# Patient Record
Sex: Male | Born: 1990 | Race: White | Hispanic: No | Marital: Single | Smoking: Current every day smoker
Health system: Southern US, Community
[De-identification: ages and names within clinical notes are randomized; demographics above are authoritative.]

## PROBLEM LIST (undated history)

## (undated) DIAGNOSIS — F329 Major depressive disorder, single episode, unspecified: Secondary | ICD-10-CM

## (undated) DIAGNOSIS — M1990 Unspecified osteoarthritis, unspecified site: Secondary | ICD-10-CM

## (undated) DIAGNOSIS — F32A Depression, unspecified: Secondary | ICD-10-CM

## (undated) DIAGNOSIS — F319 Bipolar disorder, unspecified: Secondary | ICD-10-CM

## (undated) DIAGNOSIS — F419 Anxiety disorder, unspecified: Secondary | ICD-10-CM

## (undated) DIAGNOSIS — F909 Attention-deficit hyperactivity disorder, unspecified type: Secondary | ICD-10-CM

## (undated) HISTORY — DX: Major depressive disorder, single episode, unspecified: F32.9

## (undated) HISTORY — DX: Bipolar disorder, unspecified: F31.9

## (undated) HISTORY — DX: Anxiety disorder, unspecified: F41.9

## (undated) HISTORY — DX: Attention-deficit hyperactivity disorder, unspecified type: F90.9

## (undated) HISTORY — DX: Depression, unspecified: F32.A

## (undated) HISTORY — DX: Unspecified osteoarthritis, unspecified site: M19.90

---
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
## 2011-11-29 ENCOUNTER — Emergency Department (HOSPITAL_COMMUNITY): Payer: BC Managed Care – PPO

## 2011-11-29 ENCOUNTER — Emergency Department (HOSPITAL_COMMUNITY)
Admission: EM | Admit: 2011-11-29 | Discharge: 2011-11-30 | Disposition: A | Discharge status: Home / Self Care | Payer: BC Managed Care – PPO | Attending: Emergency Medicine | Admitting: Emergency Medicine

## 2011-11-29 ENCOUNTER — Other Ambulatory Visit: Payer: Self-pay

## 2011-11-29 ENCOUNTER — Encounter (HOSPITAL_COMMUNITY): Payer: Self-pay | Admitting: *Deleted

## 2011-11-29 DIAGNOSIS — F29 Unspecified psychosis not due to a substance or known physiological condition: Secondary | ICD-10-CM | POA: Insufficient documentation

## 2011-11-29 DIAGNOSIS — F23 Brief psychotic disorder: Secondary | ICD-10-CM

## 2011-11-29 DIAGNOSIS — R4182 Altered mental status, unspecified: Secondary | ICD-10-CM | POA: Insufficient documentation

## 2011-11-29 DIAGNOSIS — F603 Borderline personality disorder: Secondary | ICD-10-CM | POA: Insufficient documentation

## 2011-11-29 DIAGNOSIS — R000 Tachycardia, unspecified: Secondary | ICD-10-CM | POA: Insufficient documentation

## 2011-11-29 LAB — RAPID URINE DRUG SCREEN, HOSP PERFORMED
Amphetamines: NOT DETECTED
Barbiturates: NOT DETECTED
Benzodiazepines: POSITIVE — AB
Cocaine: NOT DETECTED
Opiates: NOT DETECTED
Tetrahydrocannabinol: POSITIVE — AB

## 2011-11-29 LAB — URINALYSIS, ROUTINE W REFLEX MICROSCOPIC
Bilirubin Urine: NEGATIVE
Glucose, UA: NEGATIVE mg/dL
Hgb urine dipstick: NEGATIVE
Ketones, ur: NEGATIVE mg/dL
Leukocytes, UA: NEGATIVE
Nitrite: NEGATIVE
Protein, ur: NEGATIVE mg/dL
Specific Gravity, Urine: 1.011 (ref 1.005–1.030)
Urobilinogen, UA: 0.2 mg/dL (ref 0.0–1.0)
pH: 8 (ref 5.0–8.0)

## 2011-11-29 LAB — DIFFERENTIAL
Basophils Absolute: 0 K/uL (ref 0.0–0.1)
Basophils Relative: 0 % (ref 0–1)
Eosinophils Absolute: 0 K/uL (ref 0.0–0.7)
Eosinophils Relative: 0 % (ref 0–5)
Lymphocytes Relative: 11 % — ABNORMAL LOW (ref 12–46)
Lymphs Abs: 1.6 K/uL (ref 0.7–4.0)
Monocytes Absolute: 0.8 K/uL (ref 0.1–1.0)
Monocytes Relative: 6 % (ref 3–12)
Neutro Abs: 11.5 K/uL — ABNORMAL HIGH (ref 1.7–7.7)
Neutrophils Relative %: 83 % — ABNORMAL HIGH (ref 43–77)

## 2011-11-29 LAB — COMPREHENSIVE METABOLIC PANEL WITH GFR
ALT: 18 U/L (ref 0–53)
AST: 21 U/L (ref 0–37)
Albumin: 4.1 g/dL (ref 3.5–5.2)
Alkaline Phosphatase: 74 U/L (ref 39–117)
BUN: 9 mg/dL (ref 6–23)
CO2: 23 meq/L (ref 19–32)
Calcium: 9.9 mg/dL (ref 8.4–10.5)
Chloride: 104 meq/L (ref 96–112)
Creatinine, Ser: 0.86 mg/dL (ref 0.50–1.35)
GFR calc Af Amer: >90 mL/min
GFR calc non Af Amer: >90 mL/min
Glucose, Bld: 135 mg/dL — ABNORMAL HIGH (ref 70–99)
Potassium: 3.6 meq/L (ref 3.5–5.1)
Sodium: 140 meq/L (ref 135–145)
Total Bilirubin: 0.5 mg/dL (ref 0.3–1.2)
Total Protein: 7.1 g/dL (ref 6.0–8.3)

## 2011-11-29 LAB — CBC
HCT: 43.7 % (ref 39.0–52.0)
Hemoglobin: 15.9 g/dL (ref 13.0–17.0)
MCH: 30.7 pg (ref 26.0–34.0)
MCHC: 36.4 g/dL — ABNORMAL HIGH (ref 30.0–36.0)
MCV: 84.4 fL (ref 78.0–100.0)
Platelets: 177 K/uL (ref 150–400)
RBC: 5.18 MIL/uL (ref 4.22–5.81)
RDW: 12.2 % (ref 11.5–15.5)
WBC: 14 K/uL — ABNORMAL HIGH (ref 4.0–10.5)

## 2011-11-29 LAB — ETHANOL: Alcohol, Ethyl (B): <11 mg/dL (ref 0–11)

## 2011-11-29 LAB — SALICYLATE LEVEL: Salicylate Lvl: <2 mg/dL — ABNORMAL LOW (ref 2.8–20.0)

## 2011-11-29 LAB — ACETAMINOPHEN LEVEL: Acetaminophen (Tylenol), Serum: <15 ug/mL (ref 10–30)

## 2011-11-29 MED ORDER — ZIPRASIDONE MESYLATE 20 MG IM SOLR
10.0000 mg | Freq: Once | INTRAMUSCULAR | Status: AC
  Administered 2011-11-29: 10 mg via INTRAMUSCULAR

## 2011-11-29 MED ORDER — LORAZEPAM 2 MG/ML IJ SOLN
2.0000 mg | Freq: Once | INTRAMUSCULAR | Status: AC
  Administered 2011-11-29: 2 mg via INTRAVENOUS
  Filled 2011-11-29: qty 1

## 2011-11-29 MED ORDER — SODIUM CHLORIDE 0.9 % IV BOLUS (SEPSIS)
1000.0000 mL | Freq: Once | INTRAVENOUS | Status: AC
  Administered 2011-11-29: 1000 mL via INTRAVENOUS

## 2011-11-29 MED ORDER — ZIPRASIDONE MESYLATE 20 MG IM SOLR
10.0000 mg | Freq: Once | INTRAMUSCULAR | Status: AC
  Administered 2011-11-29: 10 mg via INTRAMUSCULAR
  Filled 2011-11-29: qty 20

## 2011-11-29 NOTE — ED Notes (Signed)
Resident at bedside.  

## 2011-11-29 NOTE — ED Provider Notes (Signed)
History     CSN: [id number]  Arrival date & time 11/29/11  7253   First MD Initiated Contact with Patient 11/29/11 1842      Chief Complaint  Patient presents with  . Aggressive Behavior  . Altered Mental Status    (Consider location/radiation/quality/duration/timing/severity/associated sxs/prior treatment) Patient is a 21 y.o. male presenting with altered mental status. The history is provided by the EMS personnel. The history is limited by the condition of the patient.  Altered Mental Status This is a new problem. The current episode started today. The problem occurs constantly. The problem has been unchanged. Pertinent negatives include no vomiting. The symptoms are aggravated by nothing. He has tried nothing for the symptoms.    History reviewed. No pertinent past medical history.  No past surgical history on file.  No family history on file.  History  Substance Use Topics  . Smoking status: Not on file  . Smokeless tobacco: Not on file  . Alcohol Use: Not on file      Review of Systems  Unable to perform ROS: Mental status change  Gastrointestinal: Negative for vomiting.  Psychiatric/Behavioral: Positive for altered mental status.    Allergies  Review of patient's allergies indicates no known allergies.  Home Medications  No current outpatient prescriptions on file.  BP 145/73  Pulse 106  Temp(Src) 97.5 F (36.4 C) (Oral)  Resp 18  SpO2 99%  Physical Exam  Nursing note and vitals reviewed. Constitutional: He is oriented to person, place, and time. He appears well-developed and well-nourished.  HENT:  Head: Normocephalic and atraumatic.  Right Ear: External ear normal.  Left Ear: External ear normal.  Nose: Nose normal.  Eyes: Pupils are equal, round, and reactive to light.  Neck: Neck supple.  Cardiovascular: Regular rhythm, normal heart sounds and intact distal pulses.  Tachycardia present.   Pulmonary/Chest: Effort normal and breath sounds  normal.  Abdominal: Soft. He exhibits no distension and no mass. There is no tenderness. There is no rebound and no guarding.  Musculoskeletal: He exhibits no edema.  Lymphadenopathy:    He has no cervical adenopathy.  Neurological: He is alert and oriented to person, place, and time. GCS eye subscore is 4. GCS verbal subscore is 3. GCS motor subscore is 5.  Skin: Skin is warm and dry.    ED Course  Procedures (including critical care time)  Labs Reviewed  CBC - Abnormal; Notable for the following:    WBC 14.0 (*)    MCHC 36.4 (*)    All other components within normal limits  DIFFERENTIAL - Abnormal; Notable for the following:    Neutrophils Relative 83 (*)    Neutro Abs 11.5 (*)    Lymphocytes Relative 11 (*)    All other components within normal limits  COMPREHENSIVE METABOLIC PANEL - Abnormal; Notable for the following:    Glucose, Bld 135 (*)    All other components within normal limits  SALICYLATE LEVEL - Abnormal; Notable for the following:    Salicylate Lvl <2.0 (*)    All other components within normal limits  URINE RAPID DRUG SCREEN (HOSP PERFORMED) - Abnormal; Notable for the following:    Benzodiazepines POSITIVE (*)    Tetrahydrocannabinol POSITIVE (*)    All other components within normal limits  ACETAMINOPHEN LEVEL  ETHANOL  URINALYSIS, ROUTINE W REFLEX MICROSCOPIC  URINE CULTURE   Ct Head Wo Contrast  11/29/2011  *RADIOLOGY REPORT*  Clinical Data: Altered mental status.  CT HEAD WITHOUT CONTRAST  Technique:  Contiguous axial images were obtained from the base of the skull through the vertex without contrast.  Comparison: None.  Findings: The ventricles are normal.  No extra-axial fluid collections are identified.  No CT findings for hemispheric infarction or intracranial hemorrhage.  There is a small, 3.5 mm hyperdense lesion near the foramen of Monro with a small calcification adjacent to it.  This could be a colloid cyst.  I would recommend a follow-up CT scan  in 6 months to reevaluate.  The bony structures are intact.  The paranasal sinuses and mastoid air cells are clear.  IMPRESSION:  1.  No acute intracranial findings. 2.  Small hyperdense lesion near the foramen and could be a small colloid cyst with adjacent calcification.  Recommend follow-up CT scan in 6 months.  Original Report Authenticated By: P. Loralie Champagne, M.D.   Dg Chest Portable 1 View  11/29/2011  *RADIOLOGY REPORT*  Clinical Data: Altered mental status.  PORTABLE CHEST - 1 VIEW  Comparison: None.  Findings: The cardiac silhouette, mediastinal and hilar contours are within normal limits.  The lungs are clear.  The bony thorax is intact.  IMPRESSION: No acute cardiopulmonary findings.  Original Report Authenticated By: P. Loralie Champagne, M.D.     Date: 11/30/2011  Rate: 96  Rhythm: normal sinus rhythm  QRS Axis: right  Intervals: normal  ST/T Wave abnormalities: normal  Conduction Disutrbances:none  Narrative Interpretation:   Old EKG Reviewed: none available   1. Acute psychosis       MDM  21 yo male presents with acute agitation/psychosis. Per GF patient has been acting strange for 2-3 days, and she denies any drug or alcohol use. Has been sleeping less and talking about his "inventions". Required multiple benzos as well as geodon in ED to get workup. CT head ok, no signs of infection. After medicines patient now acting more normal per family, and patient does not know what's been happening. Discussed with mom and girlfriend and patient, and no known psych hx for patient or family. Discussed with psychiatrist, who evaluated patient and feels he had acute psychotic break, and will recommend follow up but does not recommend medications. Will discuss precautions and d/c home. No SI/HI        Pricilla Loveless, MD 11/30/11 [phone]

## 2011-11-29 NOTE — ED Notes (Signed)
Foley catheter inserted by previous shift.

## 2011-11-29 NOTE — ED Notes (Signed)
Patient still in CT; Dr. Fredricka Bonine at bedside, per patient request.

## 2011-11-29 NOTE — ED Notes (Signed)
RN with patient in CT for past 30 minutes; patient refusing to transfer to CT stretcher.  Patient speaking about talking with God and states "I am [facility]".  Patient states that he does not trust staff and that he wants his Foley Catheter taken out.  Informed patient that catheter can be taken out after CT scan is finished and he is taken back to his room.  Family member at bedside in CT.  After 10 mg Geodon given, patient still combative; Dr. Fredricka Bonine informed.

## 2011-11-29 NOTE — ED Notes (Signed)
Received bedside report from [place], [place].  Patient currently resting quietly in bed; no respiratory or acute distress noted.  Updated family on plan of care; informed family that patient is going to be taken to CT.  Family has no questions or concerns at this time; will continue to monitor.

## 2011-11-29 NOTE — ED Notes (Signed)
#  14 foley cath placed. Procedure explained to patient, sterile technique used, clear yellow urine returned. Patient tolerated well.

## 2011-11-29 NOTE — ED Notes (Signed)
EKG done at 1945.

## 2011-11-29 NOTE — ED Notes (Signed)
Patient transported back to CT with RN; attempting to attach patient back to 5-lead (patient pulling at leads).  Family present at bedside.  Will continue to monitor.

## 2011-11-29 NOTE — ED Notes (Addendum)
ems called to scene due to noted change in behavior,  ems found patient crawling on the floor,  Talking incoherently.  Patient became resistive to care when ems arrived. Patient continues to talk incoherently upon arrival.  Patient was given versed 2.5mg  x 2 and arrives restrained due to attempts to get up.  Patient friend denies patient taking any drugs/substances today.  Patient is mumbling upon arrival.  Patient pupils are pinpoint.  cbg 123

## 2011-11-29 NOTE — ED Notes (Signed)
Patient currently resting quietly in bed; no respiratory or acute distress noted.  Family present at bedside.  Updated patient and family on plan of care; informed family that we are currently waiting on EDP to come and talk to family about test results.  Patient has no other questions or concerns at this time; will continue to monitor.

## 2011-11-30 LAB — URINE CULTURE
Colony Count: NO GROWTH
Culture  Setup Time: 201304010236
Culture: NO GROWTH

## 2011-11-30 NOTE — ED Notes (Signed)
No rx given, pt voiced understanding to f/u with [facility]

## 2011-11-30 NOTE — ED Notes (Addendum)
Telepsych machine placed at bedside; telepsych MD speaking with patient this time.  EMT at bedside to chaperone.

## 2011-11-30 NOTE — ED Notes (Signed)
Patient currently resting quietly in bed; no respiratory or acute distress noted.  Patient updated on plan of care; informed patient that EDP is putting in for a consult to telepsych.  Patient has no other questions or concerns at this time; will continue to monitor.

## 2011-11-30 NOTE — ED Notes (Signed)
No need for restraints at this time; will with-hold restraints.  Restraints at bedside from previous shift.

## 2011-11-30 NOTE — ED Notes (Addendum)
Telepsych finished; physician spoke with EDP (Dr. Juleen China) on phone.  Patient currently sitting up in bed; no respiratory or acute distress noted; patient updated on plan of care; informed patient that we are currently for EDP to talk with patient.  Patient has no other questions or concerns at this time; will continue to monitor.

## 2011-11-30 NOTE — Discharge Instructions (Signed)
RESOURCE GUIDE  Dental Problems  Patients with Medicaid: [facility]                     [facility] [address]                                           [address] Phone:  [phone]                                                  Phone:  [phone]  If unable to pay or uninsured, contact:  [facility] or [facility] to become qualified for the adult dental clinic.  Chronic Pain Problems Contact [facility]  [phone] Patients need to be referred by their primary care doctor.  Insufficient Money for Medicine Contact [employer]:  call "211" or [facility] 271-5999.  No Primary Care Doctor Call [facility]  832-8000 Other agencies that provide inexpensive medical care    [facility]  [phone]    [facility]  [phone]    [facility]  [phone]    Women[facility] Clinic  [phone]    [facility]  [phone]    [facility]  [phone]  Psychological Services [facility]  [phone] [facility]  [phone] [facility]   800 853-5163 (emergency services 641-4993)  Substance Abuse Resources [facility]  336-882-2125 [facility] 336-784-9470 [facility] 336-285-9073 [facility] 336-845-3988 Residential & Outpatient Substance Abuse Program  800-659-3381  Abuse/Neglect [place] Child Abuse Hotline (336) 641-3795 [place] Child Abuse Hotline 800-378-5315 (After Hours)  Emergency Shelter [facility] (336) 271-5985  M[facility] (336) 275-9566 [facility] (704) 372-4663  MRSA Hotline #:   832-7006    [place] [facility]  [facility] [place]     [employer]                          [facility] [address] [place]                       [address]      [address]  [place]                                                [place]                            [place] Phone:  [phone]                                   Phone:  [phone]                 Phone:  [phone]  [facility] Phone:  [phone]    [facility] Child Abuse Hotline 501-620-9502 7400318443 (After Hours)    Psychosis Psychosis refers to a severe lack of understanding with reality. During a psychotic episode, you are not able to think clearly. During a psychotic episode, your responses and emotions are inappropriate and do not coincide with what is actually happening. You often have false beliefs about what is happening or who you are (delusions), and you may see, hear, taste, smell, or feel things that are not present (hallucinations). Psychosis is usually a severe symptom of a very serious mental health (psychiatric) condition, but it can sometimes be the result of a medical condition. CAUSES   Psychiatric conditions, such as:   Schizophrenia.   Bipolar disorder.   Depression.   Personality disorders.   Alcohol or drug abuse.   Medical conditions, such as:   Brain injury.   Brain tumor.   Dementia.   Brain diseases, such as Alzheimer's, Parkinson's, or Huntington's disease.   Neurological diseases, such as epilepsy.   Genetic disorders.   Metabolic disorders.   Infections that affect the brain.   Certain prescription drugs.   Stroke.  SYMPTOMS   Unable to think or speak clearly or respond appropriately.   Disorganized thinking (thoughts jump from one thought to another).   Severe inappropriate behavior.   Delusions may include:   A strong belief that is odd, unrealistic, or false.   Feeling extremely fearful or suspicious (paranoid).   Believing you are someone else, have high importance, or have an altered identity.   Hallucinations.  DIAGNOSIS   Mental health evaluation.   Physical exam.    Blood tests.   Computerized magnetic scan (MRI) or other brain scans.  TREATMENT  Your caregiver will recommend a course of treatment that depends on the cause of the psychosis. Treatment may include:  Monitoring and supportive care in the hospital.   Taking medicines (antipsychotic medicine) to reduce symptoms and balance chemicals in the brain.   Taking medicines to manage underlying mental health conditions.   Therapy and other supportive programs outside of the hospital.   Treating an underlying medical condition.  If the cause of the psychosis can be treated or corrected, the outlook is good. Without treatment, psychotic episodes can cause danger to yourself or others. Treatment may be short-term or lifelong. HOME CARE INSTRUCTIONS   Take all medicines as directed. This is important.   Use a pillbox or write down your medicine schedule to make sure you are taking them.   Check with your caregiver before using over-the-counter medicines, herbs, or supplements.   Seek individual and family support through therapy and mental health education (psychoeducation) programs. These will help you manage symptoms and side effects of medicines, learn life skills, and maintain a healthy routine.   Maintain a healthy lifestyle.   Exercise regularly.   Avoid alcohol and drugs.   Learn ways to reduce stress and cope with stress, such as yoga and meditation.   Talk about your feelings with family members or caregivers.   Make time for yourself to do things you enjoy.   Know the early warning signs of psychosis. Your caregiver will recommend steps to take when you notice symptoms such as:   Feeling anxious or preoccupied.   Having racing thoughts.   Changes in your interest in life and relationships.   Follow up with your caregivers for continued outpatient treatment as directed.  SEEK MEDICAL CARE IF:   Medicines do not seem to be helping.  You hear voices telling you to  do things.   You see, smell, or feel things that are not there.   You feel hopeless and overwhelmed.   You feel extremely fearful and suspicious that something will harm you.   You feel like you cannot leave your house.   You have trouble taking care of yourself.   You experience side effects of medicines, such as changes in sleep patterns, dizziness, weight gain, restlessness, movement changes, muscle spasms, or tremors.  SEEK IMMEDIATE MEDICAL CARE IF:  Severe psychotic symptoms present a safety issue (such as an urge to hurt yourself or others). MAKE SURE YOU:   Understand these instructions.   Will watch your condition.   Will get help right away if you are not doing well or get worse.  FOR MORE INFORMATION  National Institute of Mental Health: http://www.maynard.net/ Document Released: 02/04/2010 Document Revised: 08/06/2011 Document Reviewed: 02/04/2010 [facility] Patient Information 2012 [place], [place].

## 2011-12-01 ENCOUNTER — Other Ambulatory Visit: Payer: Self-pay

## 2011-12-01 ENCOUNTER — Encounter (HOSPITAL_COMMUNITY): Payer: Self-pay | Admitting: Emergency Medicine

## 2011-12-01 ENCOUNTER — Inpatient Hospital Stay (HOSPITAL_COMMUNITY)
Admission: EM | Admit: 2011-12-01 | Discharge: 2011-12-11 | DRG: 430 | Disposition: A | Discharge status: Other Acute Inpatient Hospital | Payer: BC Managed Care – PPO | Attending: Internal Medicine | Admitting: Internal Medicine

## 2011-12-01 DIAGNOSIS — F29 Unspecified psychosis not due to a substance or known physiological condition: Principal | ICD-10-CM | POA: Diagnosis present

## 2011-12-01 DIAGNOSIS — F23 Brief psychotic disorder: Secondary | ICD-10-CM | POA: Diagnosis present

## 2011-12-01 DIAGNOSIS — I10 Essential (primary) hypertension: Secondary | ICD-10-CM | POA: Diagnosis present

## 2011-12-01 DIAGNOSIS — R4182 Altered mental status, unspecified: Secondary | ICD-10-CM | POA: Diagnosis present

## 2011-12-01 DIAGNOSIS — F12159 Cannabis abuse with psychotic disorder, unspecified: Secondary | ICD-10-CM

## 2011-12-01 DIAGNOSIS — IMO0002 Reserved for concepts with insufficient information to code with codable children: Secondary | ICD-10-CM | POA: Diagnosis present

## 2011-12-01 DIAGNOSIS — M6282 Rhabdomyolysis: Secondary | ICD-10-CM | POA: Diagnosis present

## 2011-12-01 DIAGNOSIS — D72829 Elevated white blood cell count, unspecified: Secondary | ICD-10-CM | POA: Diagnosis present

## 2011-12-01 DIAGNOSIS — R030 Elevated blood-pressure reading, without diagnosis of hypertension: Secondary | ICD-10-CM | POA: Diagnosis present

## 2011-12-01 DIAGNOSIS — Z781 Physical restraint status: Secondary | ICD-10-CM | POA: Diagnosis not present

## 2011-12-01 DIAGNOSIS — F1910 Other psychoactive substance abuse, uncomplicated: Secondary | ICD-10-CM

## 2011-12-01 LAB — CBC
HCT: 43.8 % (ref 39.0–52.0)
Hemoglobin: 16 g/dL (ref 13.0–17.0)
MCH: 31.2 pg (ref 26.0–34.0)
MCHC: 36.5 g/dL — ABNORMAL HIGH (ref 30.0–36.0)
MCV: 85.4 fL (ref 78.0–100.0)
Platelets: 172 10*3/uL (ref 150–400)
RBC: 5.13 MIL/uL (ref 4.22–5.81)
RDW: 12.3 % (ref 11.5–15.5)
WBC: 21.3 10*3/uL — ABNORMAL HIGH (ref 4.0–10.5)

## 2011-12-01 LAB — COMPREHENSIVE METABOLIC PANEL
ALT: 25 U/L (ref 0–53)
AST: 42 U/L — ABNORMAL HIGH (ref 0–37)
Albumin: 4.5 g/dL (ref 3.5–5.2)
Alkaline Phosphatase: 74 U/L (ref 39–117)
BUN: 11 mg/dL (ref 6–23)
CO2: 23 mEq/L (ref 19–32)
Calcium: 10 mg/dL (ref 8.4–10.5)
Chloride: 102 mEq/L (ref 96–112)
Creatinine, Ser: 0.9 mg/dL (ref 0.50–1.35)
GFR calc Af Amer: >90 mL/min (ref >90)
GFR calc non Af Amer: >90 mL/min (ref >90)
Glucose, Bld: 92 mg/dL (ref 70–99)
Potassium: 3.9 mEq/L (ref 3.5–5.1)
Sodium: 140 mEq/L (ref 135–145)
Total Bilirubin: 0.7 mg/dL (ref 0.3–1.2)
Total Protein: 7.2 g/dL (ref 6.0–8.3)

## 2011-12-01 LAB — RAPID URINE DRUG SCREEN, HOSP PERFORMED
Amphetamines: NOT DETECTED
Barbiturates: NOT DETECTED
Benzodiazepines: POSITIVE — AB
Cocaine: NOT DETECTED
Opiates: NOT DETECTED
Tetrahydrocannabinol: POSITIVE — AB

## 2011-12-01 LAB — CK TOTAL AND CKMB (NOT AT ARMC)
CK, MB: 8.3 ng/mL (ref 0.3–4.0)
Relative Index: 0.7 (ref 0.0–2.5)
Total CK: 1169 U/L — ABNORMAL HIGH (ref 7–232)

## 2011-12-01 LAB — ETHANOL: Alcohol, Ethyl (B): <11 mg/dL (ref 0–11)

## 2011-12-01 MED ORDER — ONDANSETRON HCL 4 MG PO TABS: 4.0000 mg | ORAL_TABLET | Freq: Three times a day (TID) | ORAL | Status: DC | PRN

## 2011-12-01 MED ORDER — LORAZEPAM 1 MG PO TABS
1.0000 mg | ORAL_TABLET | Freq: Once | ORAL | Status: AC
  Administered 2011-12-01: 1 mg via ORAL
  Filled 2011-12-01: qty 1

## 2011-12-01 MED ORDER — ACETAMINOPHEN 325 MG PO TABS
650.0000 mg | ORAL_TABLET | ORAL | Status: DC | PRN
  Administered 2011-12-02: 650 mg via ORAL
  Filled 2011-12-01: qty 2

## 2011-12-01 MED ORDER — LORAZEPAM 2 MG/ML IJ SOLN
1.0000 mg | Freq: Once | INTRAMUSCULAR | Status: AC
  Administered 2011-12-01: 1 mg via INTRAMUSCULAR
  Filled 2011-12-01: qty 1

## 2011-12-01 MED ORDER — ALUM & MAG HYDROXIDE-SIMETH 200-200-20 MG/5ML PO SUSP: 30.0000 mL | ORAL | Status: DC | PRN

## 2011-12-01 MED ORDER — ZOLPIDEM TARTRATE 5 MG PO TABS: 5.0000 mg | ORAL_TABLET | Freq: Every evening | ORAL | Status: DC | PRN

## 2011-12-01 MED ORDER — LORAZEPAM 1 MG PO TABS
1.0000 mg | ORAL_TABLET | Freq: Three times a day (TID) | ORAL | Status: DC | PRN
  Administered 2011-12-02: 1 mg via ORAL
  Filled 2011-12-01: qty 1

## 2011-12-01 NOTE — ED Notes (Signed)
Pt refused food offered.

## 2011-12-01 NOTE — BH Assessment (Signed)
Assessment Note   Bryce Leon is an 21 y.o. male. Patient brought in by [employer]. IVC paperwork taken out by his girlfriend and states that he told his girlfriend if she left him he would kill himself. Family states he has only been sleeping about 4 hrs a day and has not been showering regularly. Girlfriend at bedside sts that she has witness patient talking to himself and "paranoid of his surroundings". Patient appears to be more at his baseline during the assessment, per his girlfriend. However, she sts, "He still seems paranoid of others occasionally.Marland KitchenMarland KitchenIt comes and goes". Patient denies SI, HI, and psychotic symptoms. Sts, "I feel fine right now". He appears to have slight confusion of why he is here in the ED. He also has no recollection of any events that may have brought him to the ED. Patient reports marijuana and K2 use over the last 2 weeks. Family suspect that patients recent psychotic symptoms are related to his drug use. No alcohol abuse noted.   Patient able to contract for safety and doesn't appear to be a danger to himself or others. Tele psych inititiated and patients disposition will be determined by the recommendations of the psychiatrist.   Axis I: Psychotic Disorder NOS, Cannabis Abuse, Other (substance abuse)-K2 Axis II: Deferred Axis III: History reviewed. No pertinent past medical history. Axis IV: economic problems, other psychosocial or environmental problems, problems related to social environment, problems with access to health care services and problems with primary support group Axis V: 41-50 serious symptoms  Past Medical History: History reviewed. No pertinent past medical history.  History reviewed. No pertinent past surgical history.  Family History: History reviewed. No pertinent family history.  Social History:  reports that he has been smoking.  He does not have any smokeless tobacco history on file. He reports that he drinks alcohol. He reports that he uses  illicit drugs (Marijuana).  Additional Social History:  Alcohol / Drug Use History of alcohol / drug use?: Yes Substance #1 Name of Substance 1: THC 1 - Age of First Use: "somewhere between 60 and 76 yrs old" 1 - Amount (size/oz): "1 hit per night" 1 - Frequency: "every night" 1 - Duration: on-going since teens 1 - Last Use / Amount: "last night"/ "few blunts" Substance #2 Name of Substance 2: K2 "synthetic marijuana" 2 - Age of First Use: 19 2 - Amount (size/oz): "few hits" 2 - Frequency: 1x in 2 yrs 2 - Duration: 1x in 2 yrs 2 - Last Use / Amount: "beginning of last week" Substance #3 Name of Substance 3: Alcohol  3 - Age of First Use: teens 3 - Amount (size/oz): 1-2 beers per week 3 - Frequency: weekly 3 - Duration: on-going 3 - Last Use / Amount: "last Tuesday" Allergies: No Known Allergies  Home Medications:  Medications Prior to Admission  Medication Dose Route Frequency Provider Last Rate Last Dose  . acetaminophen (TYLENOL) tablet 650 mg  650 mg Oral Q4H PRN Shaaron Adler, PA-C      . alum & mag hydroxide-simeth (MAALOX/MYLANTA) 200-200-20 MG/5ML suspension 30 mL  30 mL Oral PRN Shaaron Adler, PA-C      . LORazepam (ATIVAN) injection 1 mg  1 mg Intramuscular Once Arman Filter, NP   1 mg at 12/01/11 0357  . LORazepam (ATIVAN) injection 1 mg  1 mg Intramuscular Once Arman Filter, NP   1 mg at 12/01/11 0441  . LORazepam (ATIVAN) tablet 1 mg  1 mg  Oral Once Shaaron Adler, PA-C   1 mg at 12/01/11 1158  . LORazepam (ATIVAN) tablet 1 mg  1 mg Oral Q8H PRN Shaaron Adler, PA-C      . ondansetron [facility]) tablet 4 mg  4 mg Oral Q8H PRN Shaaron Adler, PA-C      . zolpidem [facility]) tablet 5 mg  5 mg Oral QHS PRN Shaaron Adler, PA-C       No current outpatient prescriptions on file as of 12/01/2011.    OB/GYN Status:  No LMP for male patient.  General Assessment Data Location of Assessment: [facility] Living Arrangements:   (lives with father but about to move into g-friend moms home) Can pt return to current living arrangement?: No Admission Status: Voluntary Is patient capable of signing voluntary admission?: Yes Transfer from: Other (Comment) Referral Source: Other (brought in by [employer])  Education Status Is patient currently in school?: No  Risk to self Suicidal Ideation: No Suicidal Intent: No Is patient at risk for suicide?: No Suicidal Plan?: No Access to Means: No What has been your use of drugs/alcohol within the last 12 months?:  (K2, THC) Previous Attempts/Gestures: No How many times?:  (0) Other Self Harm Risks:  (n/a) Triggers for Past Attempts:  (None Reported) Intentional Self Injurious Behavior: None Family Suicide History: No Recent stressful life event(s): Other (Comment);Job Loss (conflict with father, living with father-unstable liviing ) Persecutory voices/beliefs?: No Depression: Yes Depression Symptoms:  (pt denies) Substance abuse history and/or treatment for substance abuse?: Yes (THC, K2, and THC) Suicide prevention information given to non-admitted patients: Not applicable  Risk to Others Homicidal Ideation: No Thoughts of Harm to Others: No Current Homicidal Intent: No Current Homicidal Plan: No Access to Homicidal Means: No Identified Victim:  (n/a) History of harm to others?: No Assessment of Violence:  (pt currently calm & cooperative; combative upon arrival) Does patient have access to weapons?: No Criminal Charges Pending?: No Does patient have a court date: No  Psychosis Hallucinations:  (denies psychotic symptoms) Delusions: None noted  Mental Status Report Appear/Hygiene:  (normal) Eye Contact: Fair Motor Activity:  (normal during assessment) Speech: Logical/coherent Level of Consciousness: Alert Mood:  (appropriate) Affect: Appropriate to circumstance Anxiety Level: Minimal Thought Processes: Coherent;Relevant Judgement: Unimpaired Orientation:  Person;Place;Time;Situation Obsessive Compulsive Thoughts/Behaviors: None  Cognitive Functioning Memory: Recent Intact;Remote Intact IQ: Average Insight: Good Impulse Control: Fair Appetite: Poor Weight Loss:  (0) Weight Gain:  (0) Sleep: Decreased Total Hours of Sleep:  (difficulty falling asleep; sts back pain causes issues sleep) Vegetative Symptoms: None  Prior Inpatient Therapy Prior Inpatient Therapy: Yes Prior Therapy Dates:  ("I was in 9th or 8th grade") Prior Therapy Facilty/Provider(s):  ("I don't remember what hospital") Reason for Treatment:  (drug use; "smoking pot")  Prior Outpatient Therapy Prior Outpatient Therapy: No Prior Therapy Dates:  (n/a) Prior Therapy Facilty/Provider(s):  (n/a) Reason for Treatment:  (n/a)  ADL Screening (condition at time of admission) [name] cognitive ability adequate to safely complete daily activities?: Yes Patient able to express need for assistance with ADLs?: Yes Independently performs ADLs?: Yes Weakness of Legs: None Weakness of Arms/Hands: None  Home Assistive Devices/Equipment Home Assistive Devices/Equipment: None    Abuse/Neglect Assessment (Assessment to be complete while patient is alone) Physical Abuse: Denies Verbal Abuse: Denies Sexual Abuse: Denies Exploitation of patient/patient's resources: Denies Self-Neglect: Denies Values / Beliefs Cultural Requests During Hospitalization: None Spiritual Requests During Hospitalization: None     Nutrition Screen Diet: Regular Unintentional weight loss  greater than 10lbs within the last month: No Problems chewing or swallowing foods and/or liquids: No Home Tube Feeding or Total Parenteral Nutrition (TPN): No Patient appears severely malnourished: No  Additional Information 1:1 In Past 12 Months?: No CIRT Risk: No Elopement Risk: No Does patient have medical clearance?: Yes     Disposition:   Disposition pending telepsych  On Site Evaluation by:     Reviewed with Physician:     Octaviano Batty 12/01/2011 4:23 PM

## 2011-12-01 NOTE — ED Provider Notes (Signed)
  5:57 PM  Tele psychiatrist agrees that pt seems much improved.  However, still not completely at baseline and he recommends holding and observing another 6-8 hours.  He is agreeable to remove IVC however.    Bryce Leon. Traver Meckes, MD 12/01/11 1757

## 2011-12-01 NOTE — ED Notes (Signed)
Spoke to attending ED doctor regarding girlfriend's concerns about plan - was informed of the reversal of IVC and the 6-8 hour of continued monitoring of the patients behavior. Girlfriend and mother is very concerned that he is not completely better yet - Informed her the MD is aware and will reassess around 1am prior to potential discharge if he remains improved

## 2011-12-01 NOTE — ED Notes (Signed)
Mother now at bedside. Mother's purse was scanned by security and locked in underneath cabinet under computer.

## 2011-12-01 NOTE — ED Notes (Signed)
Patient is talking. Patient states he does not know why he is here. Currently talking to Recruitment consultant.

## 2011-12-01 NOTE — ED Notes (Signed)
TELE [facility] COMPLETED REPORT PENDING

## 2011-12-01 NOTE — ED Notes (Signed)
Received pt. In room 25, pt. In police custody, pt. Alert, agitated, and uncooperative, pt in cuffs, transferred to stretcher, 4 point restrains applied, NAD noted

## 2011-12-01 NOTE — B[facility] Counselor (Signed)
Tele-psych initiated

## 2011-12-01 NOTE — ED Notes (Signed)
TELE [facility]  STARTED

## 2011-12-01 NOTE — ED Notes (Signed)
Pt brought in by police under IVC paperwork  Pt will not answer questions in triage
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
## 2011-12-01 NOTE — ED Notes (Signed)
Secretary Misty Stanley advised lunch tray as already been ordered.

## 2011-12-01 NOTE — ED Notes (Signed)
Patient appears very emotional regarding the fact his family is not here with him. He states he is tired of being in the room and he is ready to go. He would like anyone one to come and be with him if he gots to be here.
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
## 2011-12-02 ENCOUNTER — Encounter (HOSPITAL_COMMUNITY): Payer: Self-pay | Admitting: Internal Medicine

## 2011-12-02 ENCOUNTER — Emergency Department (HOSPITAL_COMMUNITY): Payer: BC Managed Care – PPO

## 2011-12-02 ENCOUNTER — Other Ambulatory Visit: Payer: Self-pay

## 2011-12-02 DIAGNOSIS — F1910 Other psychoactive substance abuse, uncomplicated: Secondary | ICD-10-CM | POA: Diagnosis present

## 2011-12-02 DIAGNOSIS — R4182 Altered mental status, unspecified: Secondary | ICD-10-CM | POA: Diagnosis present

## 2011-12-02 DIAGNOSIS — F23 Brief psychotic disorder: Secondary | ICD-10-CM | POA: Diagnosis present

## 2011-12-02 DIAGNOSIS — D72829 Elevated white blood cell count, unspecified: Secondary | ICD-10-CM | POA: Diagnosis present

## 2011-12-02 LAB — BASIC METABOLIC PANEL
BUN: 9 mg/dL (ref 6–23)
CO2: 28 mEq/L (ref 19–32)
Calcium: 9.6 mg/dL (ref 8.4–10.5)
Chloride: 100 mEq/L (ref 96–112)
Creatinine, Ser: 0.95 mg/dL (ref 0.50–1.35)
GFR calc Af Amer: >90 mL/min (ref >90)
GFR calc non Af Amer: >90 mL/min (ref >90)
Glucose, Bld: 101 mg/dL — ABNORMAL HIGH (ref 70–99)
Potassium: 3.6 mEq/L (ref 3.5–5.1)
Sodium: 136 mEq/L (ref 135–145)

## 2011-12-02 LAB — DIFFERENTIAL
Basophils Absolute: 0 10*3/uL (ref 0.0–0.1)
Basophils Relative: 0 % (ref 0–1)
Eosinophils Absolute: 0.1 10*3/uL (ref 0.0–0.7)
Eosinophils Relative: 1 % (ref 0–5)
Lymphocytes Relative: 15 % (ref 12–46)
Lymphs Abs: 2 10*3/uL (ref 0.7–4.0)
Monocytes Absolute: 0.9 10*3/uL (ref 0.1–1.0)
Monocytes Relative: 7 % (ref 3–12)
Neutro Abs: 10.1 10*3/uL — ABNORMAL HIGH (ref 1.7–7.7)
Neutrophils Relative %: 77 % (ref 43–77)

## 2011-12-02 LAB — CK TOTAL AND CKMB (NOT AT ARMC)
CK, MB: 6.1 ng/mL (ref 0.3–4.0)
Relative Index: 0.4 (ref 0.0–2.5)
Total CK: 1649 U/L — ABNORMAL HIGH (ref 7–232)

## 2011-12-02 LAB — CBC
HCT: 45.4 % (ref 39.0–52.0)
Hemoglobin: 16.5 g/dL (ref 13.0–17.0)
MCH: 30.7 pg (ref 26.0–34.0)
MCHC: 36.3 g/dL — ABNORMAL HIGH (ref 30.0–36.0)
MCV: 84.4 fL (ref 78.0–100.0)
Platelets: 181 10*3/uL (ref 150–400)
RBC: 5.38 MIL/uL (ref 4.22–5.81)
RDW: 12.2 % (ref 11.5–15.5)
WBC: 13.1 10*3/uL — ABNORMAL HIGH (ref 4.0–10.5)

## 2011-12-02 LAB — URINALYSIS, ROUTINE W REFLEX MICROSCOPIC
Glucose, UA: NEGATIVE mg/dL
Hgb urine dipstick: NEGATIVE
Ketones, ur: >80 mg/dL — AB
Leukocytes, UA: NEGATIVE
Nitrite: NEGATIVE
Protein, ur: NEGATIVE mg/dL
Specific Gravity, Urine: 1.031 — ABNORMAL HIGH (ref 1.005–1.030)
Urobilinogen, UA: 1 mg/dL (ref 0.0–1.0)
pH: 6 (ref 5.0–8.0)

## 2011-12-02 LAB — TROPONIN I: Troponin I: <0.3 ng/mL (ref <0.30)

## 2011-12-02 MED ORDER — ZIPRASIDONE MESYLATE 20 MG IM SOLR: 20.0000 mg | Freq: Once | INTRAMUSCULAR | Status: DC

## 2011-12-02 MED ORDER — SODIUM CHLORIDE 0.9 % IV BOLUS (SEPSIS)
1000.0000 mL | Freq: Once | INTRAVENOUS | Status: AC
  Administered 2011-12-03: 1000 mL via INTRAVENOUS

## 2011-12-02 MED ORDER — DEXTROSE 5 % IV SOLN
2.0000 g | INTRAVENOUS | Status: DC
  Administered 2011-12-03 – 2011-12-05 (×3): 2 g via INTRAVENOUS
  Filled 2011-12-02 (×3): qty 2

## 2011-12-02 MED ORDER — ZIPRASIDONE MESYLATE 20 MG IM SOLR
INTRAMUSCULAR | Status: AC
  Administered 2011-12-02: 10 mg via INTRAMUSCULAR
  Filled 2011-12-02: qty 20

## 2011-12-02 MED ORDER — ACYCLOVIR SODIUM 50 MG/ML IV SOLN
10.0000 mg/kg | Freq: Once | INTRAVENOUS | Status: AC
  Administered 2011-12-03: 830 mg via INTRAVENOUS
  Filled 2011-12-02: qty 16.6

## 2011-12-02 MED ORDER — ZIPRASIDONE MESYLATE 20 MG IM SOLR
10.0000 mg | Freq: Once | INTRAMUSCULAR | Status: AC
  Administered 2011-12-02: 10 mg via INTRAMUSCULAR

## 2011-12-02 MED ORDER — ZIPRASIDONE MESYLATE 20 MG IM SOLR
INTRAMUSCULAR | Status: AC
  Filled 2011-12-02: qty 20

## 2011-12-02 NOTE — ED Provider Notes (Signed)
Pt has been assessed by the psychiatrist again.  He is concerned about the persistent delirium and recommends a medical admisison.    Filed Vitals:   12/02/11 1736  BP: 116/83  Pulse: 81  Temp: 98.4 F (36.9 C)  Resp: 18    Pt is still confused however he has been given medications that may be causing some of his symptoms..   On my exam MM are dry.  He is alert and answers questions.  Neck is supple without meningismus.  Will recheck his labs and chest CT, CXR.   10:56 PM WBC is decreasing. Cannot rule out encephalitis.  Will plan on LP and MRI.  Discussed with Dr Nedra Hai who will admit the patient for further evaluation.  PROCEDURE NOTE The patient was prepped and draped in sterile fashion. Betadine was applied to the skin. 2% lidocaine was infiltrated in the L4-L5 interspace. 3 cc total was used. A 22-gauge needle was inserted. 2 attempts were made. LP was performed in the supine position. Approximately 5 cc of clear CSF fluid was obtained separated into 4 tubes. The tubes were sent for analysis. Patient tolerated the procedure well. He was then placed back in the supine position.  Celene Kras, MD 12/02/11 [phone]

## 2011-12-02 NOTE — ED Notes (Signed)
Spoke to patient. Patient anxious to see girlfriend. Called girlfriend and she stated she is on her way to see him. Patient anxious and refuses medication at this time. Will cont to monitor.

## 2011-12-02 NOTE — ED Notes (Signed)
Restraints removed and pt sent back to pschy after 30 min of monitoring with no restraints

## 2011-12-02 NOTE — ED Notes (Signed)
Girlfriend just arrived back to patient bedside. Will sit with patient and wait to speak with doctor. Patient is ready to go home. Girlfriend feels that patient needs further evaluation. Will cont to monitor patient behavior. Patient is currently calm and pleasant.

## 2011-12-02 NOTE — ED Notes (Signed)
Charge nurse came to bedside with security and assessed situation with assigned nurse.  Restraints replaced , and patient hands mitten to minimize him getting out of restraints. Patient remains agitated and a threat to self and others. Uncooperative. Will continue to monitor patient.

## 2011-12-02 NOTE — ED Notes (Signed)
Spoke to dr Rosalia Hammers about critical lab values of CKMB, she's aware of the numbers, she stated that his troponin is fine and unless he's shaking and stuff it's ok.

## 2011-12-02 NOTE — ED Notes (Signed)
I&O cath performed for urine sample. Sitter remains at bedside. Pt remains in restraints and pulls at them and tries to get out of them when staff approaches. Remains delirious. Resp even and unlabored.

## 2011-12-02 NOTE — ED Notes (Signed)
Pt is sitting with mom.  Per charge nurse we have taken off his restraints and are monitoring his actions before he goes back to the pschy unit to wait on someone to decide where he is going and how he is going to get there.  So far he is well behaved and he and his mom are talking calmly

## 2011-12-02 NOTE — ED Notes (Signed)
[id number]<AV> Expected date:12/02/11<BR> Expected time: 6:56 PM<BR> Means of arrival:Other<BR> Comments:<BR> Hold for [facility]

## 2011-12-02 NOTE — ED Notes (Signed)
Patient is resting comfortably. 

## 2011-12-02 NOTE — H&P (Signed)
PCP: None   Chief Complaint: Patient was admitted from behavioral health for continued altered mental status   HPI: Bryce Leon is an 21 y.o. male with polysubstance abuse (tobacco, alcohol, marijuana) admitted to behavioral health inpatient with IVC, for mental status change. He is not able to give a complete history to me, so history was obtained through medical records, treatment team physician and staff , and partly from the patient himself. There was an acute change when he become quite agitated, combative, and paranoia. He was in 4 point restraint, and admitted IVC. He had some excoriation and contusion on his neck fighting against staff. Original workup included marked leukocytosis with white count of 21,000, CPK of 1600, UDS positive for marijuana and benzodiazepine. He was felt to be psychotic secondary to marijuana use, and was assessed serially by the psychiatrist. There were reports where he was better, confirmed by his girlfriend that he was at baseline. His mental status however waxes and wanes, and still, he is in restraints. 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  Rewiew of Systems: He is lethargic, unable to get full ROS. He has no headache or double vision.    History reviewed. No pertinent past medical history.  History reviewed. No pertinent past surgical history.  Medications:  HOME MEDS: Prior to Admission medications   Not on File     Allergies:  No Known Allergies  Social History:   reports that he has been smoking.  He does not have any smokeless tobacco history on file. He reports that he drinks alcohol. He reports that he uses illicit drugs (Marijuana).  Family History: History reviewed. No pertinent family history.   Physical Exam: Filed Vitals:   12/02/11 2315 12/02/11 2330 12/02/11 2345 12/02/11 2355    BP: 123/64 130/83 128/76   Pulse: 66 77 66   Temp:      TempSrc:      Resp: 16 22 18    Height:    6' (1.829 m)  Weight:    90.719 kg (200 lb)  SpO2: 100% 100% 99%    Blood pressure 128/76, pulse 66, temperature 98.3 F (36.8 C), temperature source Oral, resp. rate 18, height 6' (1.829 m), weight 90.719 kg (200 lb), SpO2 99.00%.  GEN:  Quite lethargic but arousable person in no acute distress; cooperative with exam. He is in restraint. PSYCH: Not agitated, does not appear anxious or depressed; affect is flat. HEENT: Mucous membranes pink and anicteric; PERRLA; EOM intact; no cervical lymphadenopathy nor thyromegaly or carotid bruit; no JVD; Breasts:: Not examined CHEST WALL: No tenderness CHEST: Normal respiration, clear to auscultation bilaterally HEART: Regular rate and rhythm; no murmurs rubs or gallops BACK: No kyphosis or scoliosis; no CVA tenderness ABDOMEN: Obese, soft non-tender; no masses, no organomegaly, normal abdominal bowel sounds; no pannus; no intertriginous candida. Rectal Exam: Not done EXTREMITIES: No bone or joint deformity; age-appropriate arthropathy of the hands and knees; no edema; no ulcerations. Genitalia: not examined PULSES: 2+ and symmetric SKIN: Normal hydration no rash or ulceration, he has 1 papule in the suprapubic area. CNS: He has facial symmetry with fluent speech. Orlene Erm is limited but meaningful. Tongue is midline and uvula elevated with phonation. DTR unremarkable. Able to move all 4 extremities. No myoclonus.   Labs & Imaging Results for orders placed during the hospital encounter of 12/01/11 (  from the past 48 hour(s))  CBC     Status: Abnormal   Collection Time   12/01/11  4:25 AM      Component Value Range Comment   WBC 21.3 (*) 4.0 - 10.5 (K/uL)    RBC 5.13  4.22 - 5.81 (MIL/uL)    Hemoglobin 16.0  13.0 - 17.0 (g/dL)    HCT 16.1  09.6 - 04.5 (%)    MCV 85.4  78.0 - 100.0 (fL)    MCH 31.2  26.0 - 34.0 (pg)    MCHC 36.5 (*) 30.0 -  36.0 (g/dL)    RDW 40.9  81.1 - 91.4 (%)    Platelets 172  150 - 400 (K/uL)   COMPREHENSIVE METABOLIC PANEL     Status: Abnormal   Collection Time   12/01/11  4:25 AM      Component Value Range Comment   Sodium 140  135 - 145 (mEq/L)    Potassium 3.9  3.5 - 5.1 (mEq/L)    Chloride 102  96 - 112 (mEq/L)    CO2 23  19 - 32 (mEq/L)    Glucose, Bld 92  70 - 99 (mg/dL)    BUN 11  6 - 23 (mg/dL)    Creatinine, Ser 7.82  0.50 - 1.35 (mg/dL)    Calcium 95.6  8.4 - 10.5 (mg/dL)    Total Protein 7.2  6.0 - 8.3 (g/dL)    Albumin 4.5  3.5 - 5.2 (g/dL)    AST 42 (*) 0 - 37 (U/L)    ALT 25  0 - 53 (U/L)    Alkaline Phosphatase 74  39 - 117 (U/L)    Total Bilirubin 0.7  0.3 - 1.2 (mg/dL)    GFR calc non Af Amer >90  >90 (mL/min)    GFR calc Af Amer >90  >90 (mL/min)   ETHANOL     Status: Normal   Collection Time   12/01/11  4:25 AM      Component Value Range Comment   Alcohol, Ethyl (B) <11  0 - 11 (mg/dL)   CK TOTAL AND CKMB     Status: Abnormal   Collection Time   12/01/11  4:25 AM      Component Value Range Comment   Total CK 1169 (*) 7 - 232 (U/L)    CK, MB 8.3 (*) 0.3 - 4.0 (ng/mL)    Relative Index 0.7  0.0 - 2.5    URINE RAPID DRUG SCREEN (HOSP PERFORMED)     Status: Abnormal   Collection Time   12/01/11  5:17 AM      Component Value Range Comment   Opiates NONE DETECTED  NONE DETECTED     Cocaine NONE DETECTED  NONE DETECTED     Benzodiazepines POSITIVE (*) NONE DETECTED     Amphetamines NONE DETECTED  NONE DETECTED     Tetrahydrocannabinol POSITIVE (*) NONE DETECTED     Barbiturates NONE DETECTED  NONE DETECTED    TROPONIN I     Status: Normal   Collection Time   12/02/11  2:36 PM      Component Value Range Comment   Troponin I <0.30  <0.30 (ng/mL)   CK TOTAL AND CKMB     Status: Abnormal   Collection Time   12/02/11  2:36 PM      Component Value Range Comment   Total CK 1649 (*) 7 - 232 (U/L)    CK, MB 6.1 (*) 0.3 - 4.0 (ng/mL)  Relative Index 0.4  0.0 - 2.5    URINALYSIS,  ROUTINE W REFLEX MICROSCOPIC     Status: Abnormal   Collection Time   12/02/11  7:35 PM      Component Value Range Comment   Color, Urine AMBER (*) YELLOW  BIOCHEMICALS MAY BE AFFECTED BY COLOR   APPearance CLEAR  CLEAR     Specific Gravity, Urine 1.031 (*) 1.005 - 1.030     pH 6.0  5.0 - 8.0     Glucose, UA NEGATIVE  NEGATIVE (mg/dL)    Hgb urine dipstick NEGATIVE  NEGATIVE     Bilirubin Urine SMALL (*) NEGATIVE     Ketones, ur >80 (*) NEGATIVE (mg/dL)    Protein, ur NEGATIVE  NEGATIVE (mg/dL)    Urobilinogen, UA 1.0  0.0 - 1.0 (mg/dL)    Nitrite NEGATIVE  NEGATIVE     Leukocytes, UA NEGATIVE  NEGATIVE  MICROSCOPIC NOT DONE ON URINES WITH NEGATIVE PROTEIN, BLOOD, LEUKOCYTES, NITRITE, OR GLUCOSE <1000 mg/dL.  BASIC METABOLIC PANEL     Status: Abnormal   Collection Time   12/02/11  8:10 PM      Component Value Range Comment   Sodium 136  135 - 145 (mEq/L)    Potassium 3.6  3.5 - 5.1 (mEq/L)    Chloride 100  96 - 112 (mEq/L)    CO2 28  19 - 32 (mEq/L)    Glucose, Bld 101 (*) 70 - 99 (mg/dL)    BUN 9  6 - 23 (mg/dL)    Creatinine, Ser 2.37  0.50 - 1.35 (mg/dL)    Calcium 9.6  8.4 - 10.5 (mg/dL)    GFR calc non Af Amer >90  >90 (mL/min)    GFR calc Af Amer >90  >90 (mL/min)   CBC     Status: Abnormal   Collection Time   12/02/11  8:10 PM      Component Value Range Comment   WBC 13.1 (*) 4.0 - 10.5 (K/uL)    RBC 5.38  4.22 - 5.81 (MIL/uL)    Hemoglobin 16.5  13.0 - 17.0 (g/dL)    HCT 62.8  31.5 - 17.6 (%)    MCV 84.4  78.0 - 100.0 (fL)    MCH 30.7  26.0 - 34.0 (pg)    MCHC 36.3 (*) 30.0 - 36.0 (g/dL)    RDW 16.0  73.7 - 10.6 (%)    Platelets 181  150 - 400 (K/uL)   DIFFERENTIAL     Status: Abnormal   Collection Time   12/02/11  8:10 PM      Component Value Range Comment   Neutrophils Relative 77  43 - 77 (%)    Neutro Abs 10.1 (*) 1.7 - 7.7 (K/uL)    Lymphocytes Relative 15  12 - 46 (%)    Lymphs Abs 2.0  0.7 - 4.0 (K/uL)    Monocytes Relative 7  3 - 12 (%)    Monocytes  Absolute 0.9  0.1 - 1.0 (K/uL)    Eosinophils Relative 1  0 - 5 (%)    Eosinophils Absolute 0.1  0.0 - 0.7 (K/uL)    Basophils Relative 0  0 - 1 (%)    Basophils Absolute 0.0  0.0 - 0.1 (K/uL)    Dg Chest 2 View  12/02/2011  *RADIOLOGY REPORT*  Clinical Data: Altered mental status.  CHEST - 2 VIEW  Comparison: None  Findings: The cardiac silhouette, mediastinal and hilar contours are within normal limits.  The lungs are clear.  No pleural effusion.  The bony thorax is intact.  IMPRESSION: No acute cardiopulmonary findings.  Original Report Authenticated By: P. Loralie Champagne, M.D.   Ct Head Wo Contrast  12/02/2011  *RADIOLOGY REPORT*  Clinical Data: Mental status changes.  CT HEAD WITHOUT CONTRAST  Technique:  Contiguous axial images were obtained from the base of the skull through the vertex without contrast.  Comparison: None  Findings: The ventricles are normal.  No extra-axial fluid collections are seen.  The brainstem and cerebellum are unremarkable.  No acute intracranial findings such as infarction or hemorrhage.  No mass lesions. There is a very subtle hyperdensity in the region of the foramen of Monro.  This could be a small colloid cyst.  No hydrocephalus.  I would recommend follow-up noncontrast CT scan in 6 months to reevaluate.  The bony calvarium is intact.  The visualized paranasal sinuses and mastoid air cells are clear.  IMPRESSION:  1.  No acute intracranial findings or mass lesion. 2.  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 Recommend follow-up CT scan in 6 months.  Original Report Authenticated By: P. Loralie Champagne, M.D.      Assessment Present on Admission:  .Altered mental status .Substance abuse .Leukocytosis Mild rhabdomyolysis  PLAN: Need to be admitted, and he would need a lumbar puncture. We'll get an MRI of his head along with an EEG. We'll withhold medication and continue to observe him. HSV encephalitis was considered, test will be sent for this as  well. He also has mild rhabdomyolysis and will be given IV fluids. In the differential would include primary psychiatric disorder precipitated by stress or illicit drug abuse. I have asked Dr. Thana Farr to consult and make further recommendations. He is hemodynamically stable, and will be admitted to telemetry under the triad hospitalist service.  Other plans as per orders.    Amiyah Shryock 12/02/2011, 11:58 PM

## 2011-12-02 NOTE — ED Provider Notes (Signed)
Medical screening examination/treatment/procedure(s) were performed by non-physician practitioner and as supervising physician I was immediately available for consultation/collaboration.  Aasiya Creasey, MD 12/02/11 2127 

## 2011-12-02 NOTE — ED Notes (Signed)
Pt becoming aggressive not following direction.  Multiple attempts to redirect pt back to stretcher.  Restraints reapplied, sitter at bedside.
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
## 2011-12-02 NOTE — ED Notes (Signed)
Pt posturing and becoming aggressive towards staff.  Refusing to go back into room.  Mother removed from room. Assisted by security and [employer] to get pt back into room.

## 2011-12-02 NOTE — ED Provider Notes (Signed)
Date: 12/02/2011  Rate: 72  Rhythm: normal sinus rhythm  QRS Axis: normal  Intervals: normal  ST/T Wave abnormalities: normal  Conduction Disutrbances: none  Narrative Interpretation: unremarkable    Patient has be continued to be intermittently combative. He has required restraints. He has had a repeat EKG done here as noted above which appears normal. This was done per the request of behavioral health. He had an elevated total CK and MB yesterday. Per behavioral health as requested troponin was ordered and this is normal.    Hilario Quarry, MD 12/02/11 1520
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
## 2011-12-02 NOTE — ED Notes (Signed)
Patient repetitively hollering out that he needs help urinating. Patient wants a male nurse to come in the room to help him urinate. Patient urinate a small amt in urinal. Sitter and nurse decided to place condom cath on patient to help him collect his urine. Applied by tech. Nurse observed patient, patient  slept through application. Will cont to monitor.

## 2011-12-02 NOTE — ED Notes (Signed)
Received pt. From psych unit via stretcher in 4 point restraints with sitter, pt. Alert and responsive, NAD noted,

## 2011-12-02 NOTE — ED Notes (Signed)
Patient is awake . Requesting to see his girlfriend and fidgeting in bed.

## 2011-12-02 NOTE — ED Notes (Signed)
Pt transferred to room 7, report given to RN. 2 bags belongings sent with pt from locker #42. Glasses are with sitter, sitter remains with pt.
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
## 2011-12-03 ENCOUNTER — Inpatient Hospital Stay (HOSPITAL_COMMUNITY): Payer: BC Managed Care – PPO

## 2011-12-03 DIAGNOSIS — F12159 Cannabis abuse with psychotic disorder, unspecified: Secondary | ICD-10-CM | POA: Diagnosis present

## 2011-12-03 DIAGNOSIS — I10 Essential (primary) hypertension: Secondary | ICD-10-CM | POA: Diagnosis present

## 2011-12-03 LAB — GRAM STAIN: Gram Stain: NONE SEEN

## 2011-12-03 LAB — PROTEIN AND GLUCOSE, CSF
Glucose, CSF: 70 mg/dL (ref 43–76)
Total  Protein, CSF: 17 mg/dL (ref 15–45)

## 2011-12-03 LAB — CSF CELL COUNT WITH DIFFERENTIAL
RBC Count, CSF: 193 /mm3 — ABNORMAL HIGH
Tube #: 1
WBC, CSF: 0 /mm3 (ref 0–5)

## 2011-12-03 MED ORDER — HALOPERIDOL LACTATE 5 MG/ML IJ SOLN
INTRAMUSCULAR | Status: AC
  Filled 2011-12-03: qty 1

## 2011-12-03 MED ORDER — HALOPERIDOL LACTATE 5 MG/ML IJ SOLN
1.0000 mg | Freq: Four times a day (QID) | INTRAMUSCULAR | Status: DC | PRN
  Administered 2011-12-03 – 2011-12-04 (×2): 1 mg via INTRAVENOUS
  Filled 2011-12-03 (×3): qty 1

## 2011-12-03 MED ORDER — GADOBENATE DIMEGLUMINE 529 MG/ML IV SOLN
16.0000 mL | Freq: Once | INTRAVENOUS | Status: AC | PRN
  Administered 2011-12-03: 16 mL via INTRAVENOUS

## 2011-12-03 MED ORDER — LORAZEPAM 2 MG/ML IJ SOLN
2.0000 mg | INTRAMUSCULAR | Status: DC | PRN
Start: 2011-12-03 — End: 2011-12-07
  Administered 2011-12-03 – 2011-12-07 (×7): 2 mg via INTRAVENOUS
  Filled 2011-12-03 (×7): qty 1

## 2011-12-03 MED ORDER — LORAZEPAM 2 MG/ML IJ SOLN
1.0000 mg | Freq: Once | INTRAMUSCULAR | Status: AC
  Administered 2011-12-03: 1 mg via INTRAVENOUS
  Filled 2011-12-03: qty 1

## 2011-12-03 MED ORDER — LABETALOL HCL 200 MG PO TABS
200.0000 mg | ORAL_TABLET | Freq: Two times a day (BID) | ORAL | Status: DC
  Administered 2011-12-04 – 2011-12-06 (×4): 200 mg via ORAL
  Filled 2011-12-03 (×10): qty 1

## 2011-12-03 NOTE — Consult Note (Signed)
Reason for Consult:Altered Mental Status Referring Physician: Conley Rolls  CC: Altered Mental Status  HPI: Bryce Leon is an 21 y.o. male who presented on 4/2 with altered mental status.  Patient gives no history this morning.  Much of the history is obtained from the chart.  The patient seems to have been brought in for IVC on 4/2.  Report of family at that time was that the patient had been sleeping little (4 hors /day), was not showering regularly and had been talking to himself.  Patient became combative and agitated. On initial work up had drug screen positive for benzodiazepines and THC.  Head CT only remarkable for a colloid cyst.  WBC count was elevated.  Patient was admitted to [facility] and seemed to show some improvement with a waxing and waning mental status but without return to baseline patient was brought to the ED for further evaluation.  Patient has required 4-point restraints.    History reviewed. No pertinent past medical history.  History reviewed. No pertinent past surgical history.  History reviewed. No pertinent family history.  Social History:  reports that he has been smoking.  He does not have any smokeless tobacco history on file. He reports that he drinks alcohol. He reports that he uses illicit drugs (Marijuana).  No Known Allergies  Medications:  I have reviewed the patient's current medications. Prior to Admission:  No prescriptions prior to admission   Scheduled:   . acyclovir  10 mg/kg (Adjusted) Intravenous Once  . cefTRIAXone (ROCEPHIN)  IV  2 g Intravenous Q24H  . sodium chloride  1,000 mL Intravenous Once  . ziprasidone      . ziprasidone  10 mg Intramuscular Once    ROS: Unable to obtain  Physical Examination: Blood pressure 155/80, pulse 73, temperature 98.1 F (36.7 C), temperature source Oral, resp. rate 18, height 6' (1.829 m), weight 81.5 kg (179 lb 10.8 oz), SpO2 100.00%.  Neurologic Examination Mental Status: Alert.  Gives some one-word  answers to questions but does not answer orientation questions and reports that he does not know why he is here or how he got here.  Follows some simple commands. Cranial Nerves: II: blinks to bilateral confrontation, pupils equal, round, reactive to light and accommodation III,IV, VI: ptosis not present, extra-ocular motions intact bilaterally V,VII: smile symmetric, facial light touch sensation normal bilaterally VIII: hearing normal bilaterally IX,X: gag reflex present XI: trapezius strength/neck flexion strength normal bilaterally XII: tongue strength unable to test Motor: Patient will not move his extremities voluntarily.  When I lift his legs he lets them drop back down to the bed.  When I lift his arms he dose the same but does not allow them to touch his face.  Tone normal.  No atrophy noted. Sensory: unable to test accurately Deep Tendon Reflexes: 2+ and symmetric throughout Plantars: Right: downgoing   Left: downgoing Cerebellar: Unable to test  Results for orders placed during the hospital encounter of 12/01/11 (from the past 48 hour(s))  URINE RAPID DRUG SCREEN (HOSP PERFORMED)     Status: Abnormal   Collection Time   12/01/11  5:17 AM      Component Value Range Comment   Opiates NONE DETECTED  NONE DETECTED     Cocaine NONE DETECTED  NONE DETECTED     Benzodiazepines POSITIVE (*) NONE DETECTED     Amphetamines NONE DETECTED  NONE DETECTED     Tetrahydrocannabinol POSITIVE (*) NONE DETECTED     Barbiturates NONE DETECTED  NONE  DETECTED    TROPONIN I     Status: Normal   Collection Time   12/02/11  2:36 PM      Component Value Range Comment   Troponin I <0.30  <0.30 (ng/mL)   CK TOTAL AND CKMB     Status: Abnormal   Collection Time   12/02/11  2:36 PM      Component Value Range Comment   Total CK 1649 (*) 7 - 232 (U/L)    CK, MB 6.1 (*) 0.3 - 4.0 (ng/mL)    Relative Index 0.4  0.0 - 2.5    URINALYSIS, ROUTINE W REFLEX MICROSCOPIC     Status: Abnormal   Collection Time     12/02/11  7:35 PM      Component Value Range Comment   Color, Urine AMBER (*) YELLOW  BIOCHEMICALS MAY BE AFFECTED BY COLOR   APPearance CLEAR  CLEAR     Specific Gravity, Urine 1.031 (*) 1.005 - 1.030     pH 6.0  5.0 - 8.0     Glucose, UA NEGATIVE  NEGATIVE (mg/dL)    Hgb urine dipstick NEGATIVE  NEGATIVE     Bilirubin Urine SMALL (*) NEGATIVE     Ketones, ur >80 (*) NEGATIVE (mg/dL)    Protein, ur NEGATIVE  NEGATIVE (mg/dL)    Urobilinogen, UA 1.0  0.0 - 1.0 (mg/dL)    Nitrite NEGATIVE  NEGATIVE     Leukocytes, UA NEGATIVE  NEGATIVE  MICROSCOPIC NOT DONE ON URINES WITH NEGATIVE PROTEIN, BLOOD, LEUKOCYTES, NITRITE, OR GLUCOSE <1000 mg/dL.  BASIC METABOLIC PANEL     Status: Abnormal   Collection Time   12/02/11  8:10 PM      Component Value Range Comment   Sodium 136  135 - 145 (mEq/L)    Potassium 3.6  3.5 - 5.1 (mEq/L)    Chloride 100  96 - 112 (mEq/L)    CO2 28  19 - 32 (mEq/L)    Glucose, Bld 101 (*) 70 - 99 (mg/dL)    BUN 9  6 - 23 (mg/dL)    Creatinine, Ser 1.61  0.50 - 1.35 (mg/dL)    Calcium 9.6  8.4 - 10.5 (mg/dL)    GFR calc non Af Amer >90  >90 (mL/min)    GFR calc Af Amer >90  >90 (mL/min)   CBC     Status: Abnormal   Collection Time   12/02/11  8:10 PM      Component Value Range Comment   WBC 13.1 (*) 4.0 - 10.5 (K/uL)    RBC 5.38  4.22 - 5.81 (MIL/uL)    Hemoglobin 16.5  13.0 - 17.0 (g/dL)    HCT 09.6  04.5 - 40.9 (%)    MCV 84.4  78.0 - 100.0 (fL)    MCH 30.7  26.0 - 34.0 (pg)    MCHC 36.3 (*) 30.0 - 36.0 (g/dL)    RDW 81.1  91.4 - 78.2 (%)    Platelets 181  150 - 400 (K/uL)   DIFFERENTIAL     Status: Abnormal   Collection Time   12/02/11  8:10 PM      Component Value Range Comment   Neutrophils Relative 77  43 - 77 (%)    Neutro Abs 10.1 (*) 1.7 - 7.7 (K/uL)    Lymphocytes Relative 15  12 - 46 (%)    Lymphs Abs 2.0  0.7 - 4.0 (K/uL)    Monocytes Relative 7  3 - 12 (%)  Monocytes Absolute 0.9  0.1 - 1.0 (K/uL)    Eosinophils Relative 1  0 - 5 (%)     Eosinophils Absolute 0.1  0.0 - 0.7 (K/uL)    Basophils Relative 0  0 - 1 (%)    Basophils Absolute 0.0  0.0 - 0.1 (K/uL)   CSF CELL COUNT WITH DIFFERENTIAL     Status: Abnormal   Collection Time   12/02/11 11:35 PM      Component Value Range Comment   Tube # 1      Color, CSF COLORLESS  COLORLESS     Appearance, CSF CLEAR  CLEAR     RBC Count, CSF 193 (*) 0 (/cu mm)    WBC, CSF 0  0 - 5 (/cu mm)    Other Cells, CSF TOO FEW TO COUNT, SMEAR AVAILABLE FOR REVIEW   RARE LYMPHOCYTE AND NEUTROPHIL SEEN  PROTEIN AND GLUCOSE, CSF     Status: Normal   Collection Time   12/02/11 11:35 PM      Component Value Range Comment   Glucose, CSF 70  43 - 76 (mg/dL)    Total  Protein, CSF 17  15 - 45 (mg/dL)   GRAM STAIN     Status: Normal   Collection Time   12/02/11 11:35 PM      Component Value Range Comment   Specimen Description CSF      Special Requests NONE      Gram Stain        Value: NO ORGANISMS SEEN     WBC PRESENT     CYTOSPIN SMEAR     CALLED TO BOUFFARD,D/ED @0142  ON 12/03/11 BY KARCZEWSKI,S.   Report Status 12/03/2011 FINAL       Recent Results (from the past 240 hour(s))  GRAM STAIN     Status: Normal   Collection Time   12/02/11 11:35 PM      Component Value Range Status Comment   Specimen Description CSF   Final    Special Requests NONE   Final    Gram Stain     Final    Value: NO ORGANISMS SEEN     WBC PRESENT     CYTOSPIN SMEAR     CALLED TO BOUFFARD,D/ED @0142  ON 12/03/11 BY KARCZEWSKI,S.   Report Status 12/03/2011 FINAL   Final     Dg Chest 2 View  12/02/2011  *RADIOLOGY REPORT*  Clinical Data: Altered mental status.  CHEST - 2 VIEW  Comparison: None  Findings: The cardiac silhouette, mediastinal and hilar contours are within normal limits.  The lungs are clear.  No pleural effusion.  The bony thorax is intact.  IMPRESSION: No acute cardiopulmonary findings.  Original Report Authenticated By: P. Loralie Champagne, M.D.   Ct Head Wo Contrast  12/02/2011  *RADIOLOGY REPORT*   Clinical Data: Mental status changes.  CT HEAD WITHOUT CONTRAST  Technique:  Contiguous axial images were obtained from the base of the skull through the vertex without contrast.  Comparison: None  Findings: The ventricles are normal.  No extra-axial fluid collections are seen.  The brainstem and cerebellum are unremarkable.  No acute intracranial findings such as infarction or hemorrhage.  No mass lesions. There is a very subtle hyperdensity in the region of the foramen of Monro.  This could be a small colloid cyst.  No hydrocephalus.  I would recommend follow-up noncontrast CT scan in 6 months to reevaluate.  The bony calvarium is intact.  The visualized paranasal sinuses  and mastoid air cells are clear.  IMPRESSION:  1.  No acute intracranial findings or mass lesion. 2.  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 Recommend follow-up CT scan in 6 months.  Original Report Authenticated By: P. Loralie Champagne, M.D.     Assessment/Plan:  Patient Active Hospital Problem List:  Altered mental status (12/02/2011)   Assessment: Patient with altered mental status that very well may have had a subacute onset as opposed to an acute one.  Work up does not suggest an etiology.  Exam is nonfocal.  WBC count elevated but improving without antibiotics and would consider the need to continue using Ceftriaxone since it may further blur the clinical picture.  Patient does not have a meningitis clinically or per LP results.  Patient does have a history of poly substance abuse and ETOH abuse.  CT unremarkable.  LFT's only minimally elevated.  With patient's waxing and waning status can not fully rule out the contribution of substances of abuse and possible withdrawal from such.  Am also concerned though that the patient may be having his first psychotic break in what may end up being a chronic psychiatric illness.  Elevated CK likely related to his combativeness and his need for restraints   Plan:  1.  D/C  Ceftriaxone unless a source of infection noted.    2.  Follow CK and WBC count  3.  Agree with EEG   4.  Psychiatry should continue to follow this patient.   5.  As patient becomes more cooperative would consider an MRI of the brain   Thana Farr, MD [facility] [phone] 12/03/2011, 5:02 AM

## 2011-12-03 NOTE — ED Provider Notes (Signed)
Medical screening examination/treatment/procedure(s) were performed by non-physician practitioner and as supervising physician I was immediately available for consultation/collaboration.  Raeford Razor, MD 12/03/11 [phone]
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
## 2011-12-03 NOTE — Progress Notes (Signed)
Pt has flat affect, when questioned makes little comments, not directly related to conversation. Pt has had 2 episodes of shaking, small tremors lasting less than 10 seconds.

## 2011-12-03 NOTE — Progress Notes (Signed)
Attempted to take patient out of restraints, pt grew agitated and started making bizzare comments and violent remarks to staff. MD notified new medication orders were placed.   Attempted to console. Applied 4-point restraints. Hydration attempts were made pt denied. Girlfriend at bedside.   [name], Bryce Leon

## 2011-12-03 NOTE — Progress Notes (Addendum)
Patient ID: Bryce Leon, male   DOB: 1991/07/31, 21 y.o.   MRN: [id number]  Assessment/Plan:  Principal Problem:   *Acute psychosis - unclear etiology perhaps due to substance abuse, HSV infection or acute bipolar disorder/schizophrenia - psych consult appreciated - as per patient's girlfriend patient has been taking LSD and xanax - aitvan and haldol PRN  Active Problems:  Hypertension - start labetalol 200 mg BID   Altered mental status - unclear etiology - follow up with psych recommendations - follow up MRI brain - follow up EEG - neurology following   Leukocytosis - improving, has received 1 dose of acyclovir and ceftriaxone - perhaps this is related to HSV infection although withdrawal from substance abuse seems more likely - continue ceftriaxone - follow up MRI brain   EDUCATION - test results and diagnostic studies were discussed with patient and pt's family who was present at the bedside - patient and family have verbalized the understanding - questions were answered at the bedside and contact information was provided for additional questions or concerns   Subjective: No events overnight. Patient denies chest pain, shortness of breath, abdominal pain.  Objective:  Vital signs in last 24 hours:  Filed Vitals:   12/03/11 0200 12/03/11 0224 12/03/11 0619 12/03/11 1255  BP: 135/85 155/80 139/76 176/76  Pulse: 73 73 83 94  Temp:  98.1 F (36.7 C) 99.4 F (37.4 C)   TempSrc:  Oral Oral Oral  Resp: 19 18 18 19   Height:  6' (1.829 m)    Weight:  81.5 kg (179 lb 10.8 oz)    SpO2: 99% 100% 99% 96%    Intake/Output from previous day:   Gross per 24 hour  Intake    600 ml  Output    751 ml  Net   -151 ml   Physical Exam: General:  no acute distress. HEENT: No bruits, no goiter. Moist mucous membranes, no scleral icterus, no conjunctival pallor. Heart: Regular rate and rhythm, S1/S2 +, no murmurs, rubs, gallops. Lungs: Clear to auscultation  bilaterally. No wheezing, no rhonchi, no rales.  Abdomen: Soft, nontender, nondistended, positive bowel sounds. Extremities: No clubbing or cyanosis, no pitting edema,  positive pedal pulses. Neuro: Grossly nonfocal.  Lab Results:  Lab 12/02/11 2010 12/01/11 0425  WBC 13.1* 21.3*  HGB 16.5 16.0  HCT 45.4 43.8  PLT 181 172  MCV 84.4 85.4    Lab 12/02/11 2010 12/01/11 0425  NA 136 140  K 3.6 3.9  CL 100 102  CO2 28 23  GLUCOSE 101* 92  BUN 9 11  CREATININE 0.95 0.90  CALCIUM 9.6 10.0    Lab 12/02/11 1436 12/01/11 0425  CKMB 6.1* 8.3*  TROPONINI <0.30 --    Recent Results (from the past 240 hour(s))  CSF CULTURE     Status: Normal (Preliminary result)   Collection Time   12/02/11 11:35 PM      Component Value Range Status Comment   Specimen Description CSF   Final    Special Requests NONE   Final    Gram Stain     Final    Value: CYTOSPIN WBC NO ORGANISMS SEEN     Gram Stain Report Called to,Read Back By and Verified With: Gram Stain Report Called to,Read Back By and Verified With: BOUFFARD D/ED 0142 12/03/11 BY Governor Specking S Performed by [facility]   Culture PENDING   Incomplete    Report Status PENDING   Incomplete   GRAM STAIN  Status: Normal   Collection Time   12-04-2011 11:35 PM      Component Value Range Status Comment   Specimen Description CSF   Final    Special Requests NONE   Final    Gram Stain     Final    Value: NO ORGANISMS SEEN     WBC PRESENT     CYTOSPIN SMEAR     CALLED TO BOUFFARD,D/ED @0142  ON 12/03/11 BY KARCZEWSKI,S.   Report Status 12/03/2011 FINAL   Final     Studies/Results: Dg Chest 2 View 12-04-2011  IMPRESSION: No acute cardiopulmonary findings.    Ct Head Wo Contrast December 04, 2011    IMPRESSION:  1.  No acute intracranial findings or mass lesion. 2.  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 Recommend follow-up CT scan in 6 months.    Medications: Scheduled Meds:   . acyclovir  10 mg/kg (Adjusted) Intravenous Once   .  (ROCEPHIN)  IV  2 g Intravenous Q24H  . labetalol  200 mg Oral BID  . LORazepam  1 mg Intravenous Once   Continuous Infusions:  PRN Meds:.acetaminophen, alum & mag hydroxide-simeth, haloperidol lactate, LORazepam, ondansetron, zolpidem, DISCONTD: LORazepam   LOS: 2 days   Izel Hochberg 12/03/2011, 2:40 PM  [facility] Pager: [phone]

## 2011-12-03 NOTE — Progress Notes (Signed)
UR complete 
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
## 2011-12-04 ENCOUNTER — Other Ambulatory Visit (HOSPITAL_COMMUNITY): Payer: BC Managed Care – PPO

## 2011-12-04 ENCOUNTER — Inpatient Hospital Stay (HOSPITAL_COMMUNITY): Payer: BC Managed Care – PPO

## 2011-12-04 LAB — CBC
HCT: 45.6 % (ref 39.0–52.0)
Hemoglobin: 16.7 g/dL (ref 13.0–17.0)
MCH: 30.9 pg (ref 26.0–34.0)
MCHC: 36.6 g/dL — ABNORMAL HIGH (ref 30.0–36.0)
MCV: 84.4 fL (ref 78.0–100.0)
Platelets: 162 10*3/uL (ref 150–400)
RBC: 5.4 MIL/uL (ref 4.22–5.81)
RDW: 12.4 % (ref 11.5–15.5)
WBC: 9.9 10*3/uL (ref 4.0–10.5)

## 2011-12-04 LAB — VDRL, CSF: VDRL Quant, CSF: NONREACTIVE

## 2011-12-04 LAB — BASIC METABOLIC PANEL
BUN: 9 mg/dL (ref 6–23)
CO2: 26 mEq/L (ref 19–32)
Calcium: 9.6 mg/dL (ref 8.4–10.5)
Chloride: 102 mEq/L (ref 96–112)
Creatinine, Ser: 0.92 mg/dL (ref 0.50–1.35)
GFR calc Af Amer: >90 mL/min (ref >90)
GFR calc non Af Amer: >90 mL/min (ref >90)
Glucose, Bld: 101 mg/dL — ABNORMAL HIGH (ref 70–99)
Potassium: 3.7 mEq/L (ref 3.5–5.1)
Sodium: 139 mEq/L (ref 135–145)

## 2011-12-04 MED ORDER — LABETALOL HCL 5 MG/ML IV SOLN
5.0000 mg | Freq: Once | INTRAVENOUS | Status: AC
  Administered 2011-12-04: 5 mg via INTRAVENOUS
  Filled 2011-12-04: qty 4

## 2011-12-04 MED ORDER — ONDANSETRON HCL 4 MG/2ML IJ SOLN
4.0000 mg | Freq: Four times a day (QID) | INTRAMUSCULAR | Status: DC | PRN
  Administered 2011-12-05: 4 mg via INTRAVENOUS
  Filled 2011-12-04: qty 2

## 2011-12-04 MED ORDER — SODIUM CHLORIDE 0.9 % IJ SOLN
3.0000 mL | Freq: Two times a day (BID) | INTRAMUSCULAR | Status: DC
  Administered 2011-12-04 – 2011-12-06 (×4): 3 mL via INTRAVENOUS

## 2011-12-04 MED ORDER — ENOXAPARIN SODIUM 40 MG/0.4ML ~~LOC~~ SOLN
40.0000 mg | SUBCUTANEOUS | Status: DC
  Administered 2011-12-04 – 2011-12-11 (×6): 40 mg via SUBCUTANEOUS
  Filled 2011-12-04 (×9): qty 0.4

## 2011-12-04 MED ORDER — DEXTROSE-NACL 5-0.9 % IV SOLN: INTRAVENOUS | Status: DC

## 2011-12-04 MED ORDER — ONDANSETRON HCL 4 MG PO TABS: 4.0000 mg | ORAL_TABLET | Freq: Four times a day (QID) | ORAL | Status: DC | PRN

## 2011-12-04 NOTE — Progress Notes (Signed)
Pt attempting to chew through his restraints.  He is moving about in bed.  Broke the foot rest on the end of the bed.  States " Lets just go smoke one together". Encouraged pt to remain in bed and to rest.

## 2011-12-04 NOTE — Progress Notes (Signed)
Pt moving about in bed hitting arms on side rails  States " What I need is a xanax that will help me relax".  Encouraged pt to stay in bed and not to hit his arms on the siderails.

## 2011-12-04 NOTE — Progress Notes (Signed)
Pt spit out labetalol and then refused to take it.  Text page sent  To Dr Elisabeth Pigeon and new orders received.

## 2011-12-04 NOTE — Progress Notes (Signed)
[facility] PROGRESS NOTE    SUBJECTIVE   Patient alert and cooperative.  States he has no complaints.  EEG was attempted but unable do to mechanical failure.  OBJECTIVE   Vital signs in last 24 hours: Temp:  [98.9 F (37.2 C)] 98.9 F (37.2 C) (04/04 1700) Pulse Rate:  [90-101] 92  (04/05 1340) Resp:  [18-20] 18  (04/05 1340) BP: (126-149)/(77-96) 126/77 mmHg (04/05 1340) SpO2:  [99 %-100 %] 100 % (04/05 1340)  Intake/Output from previous day: 04/04 0701 - 04/05 0700 In: 700 [P.O.:600; IV Piggyback:100] Out: 1451 [Urine:1450; Stool:1] Intake/Output this shift: Total I/O In: 500 [P.O.:500] Out: 900 [Urine:900] Nutritional status: General  History reviewed. No pertinent past medical history.  Neurologic Exam:  Mental Status: Alert, oriented,calm and following all commands.  Speech fluent without evidence of aphasia. Able to follow 3 step commands without difficulty.  Cranial Nerves: II-Visual fields grossly intact. III/IV/VI-Extraocular movements intact.  Pupils reactive bilaterally. V/VII-Smile symmetric VIII-grossly intact IX/X-normal gag XI-bilateral shoulder shrug XII-midline tongue extension Motor: 5/5 bilaterally with normal tone and bulk Sensory: Pinprick and light touch intact throughout, bilaterally Deep Tendon Reflexes: 2+ and symmetric throughout Plantars: Downgoing bilaterally Cerebellar: Normal finger-to-nose, normal heel-to-shin test.   gait not assessed.   Lab Results: No results found for this basename: cbc, bmp, coags, chol, tri, ldl, hga1c   Lipid Panel No results found for this basename: CHOL,TRIG,HDL,CHOLHDL,VLDL,LDLCALC in the last 72 hours  Studies/Results: Dg Chest 2 View  12/02/2011  *RADIOLOGY REPORT*  Clinical Data: Altered mental status.  CHEST - 2 VIEW  Comparison: None  Findings: The cardiac silhouette, mediastinal and hilar contours are within normal limits.  The lungs are clear.  No  pleural effusion.  The bony thorax is intact.  IMPRESSION: No acute cardiopulmonary findings.  Original Report Authenticated By: P. Loralie Champagne, M.D.   Ct Head Wo Contrast  12/02/2011  *RADIOLOGY REPORT*  Clinical Data: Mental status changes.  CT HEAD WITHOUT CONTRAST  Technique:  Contiguous axial images were obtained from the base of the skull through the vertex without contrast.  Comparison: None  Findings: The ventricles are normal.  No extra-axial fluid collections are seen.  The brainstem and cerebellum are unremarkable.  No acute intracranial findings such as infarction or hemorrhage.  No mass lesions. There is a very subtle hyperdensity in the region of the foramen of Monro.  This could be a small colloid cyst.  No hydrocephalus.  I would recommend follow-up noncontrast CT scan in 6 months to reevaluate.  The bony calvarium is intact.  The visualized paranasal sinuses and mastoid air cells are clear.  IMPRESSION:  1.  No acute intracranial findings or mass lesion. 2.  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 Recommend follow-up CT scan in 6 months.  Original Report Authenticated By: P. Loralie Champagne, M.D.   [name] Wo Contrast  12/03/2011  *RADIOLOGY REPORT*  Clinical Data: Altered mental status.  Substance abuse.  Abnormal head CT.  MRI HEAD WITHOUT AND WITH CONTRAST  Technique:  Multiplanar, multiecho pulse sequences of the brain and surrounding structures were obtained according to standard protocol without and with intravenous contrast  Contrast: 16mL MULTIHANCE GADOBENATE DIMEGLUMINE 529 MG/ML IV SOLN  Comparison: Head CT 12/02/2011  Findings: The brain  has a normal appearance on all pulse sequences without evidence of malformation, atrophy, old or acute infarction, mass lesion, hemorrhage, hydrocephalus or extra-axial collection. Density near the foramen of Monro at CT relates to normal choroid. T1 images post contrast could not be obtained because of patient agitation.  No  pituitary mass.  No inflammatory sinus disease.  IMPRESSION: Negative MRI of the brain.  I do not think there is a colloid cyst but think the density at CT relates to a normal choroid.  See above.  Original Report Authenticated By: Thomasenia Sales, M.D.    Medications:     Scheduled:   . cefTRIAXone (ROCEPHIN)  IV  2 g Intravenous Q24H  . enoxaparin  40 mg Subcutaneous Q24H  . haloperidol lactate      . labetalol  200 mg Oral BID  . labetalol  5 mg Intravenous Once  . sodium chloride  3 mL Intravenous Q12H    Assessment/Plan:   21 YO male with polysubstance abuse and questionable acute versus subacute onset of mental status change. MRI shows no abnormalities. Likely acute onset of psychosis.  Psychiatry has evaluated patient and states he is a candidate for inpatient psychiatric therapy.    Recommend: 1) If patient is still present in hospital on monday may attempt EEG again, but do not see reason this should hold up discharge.  2) Recommend inpatient psychiatric treatment if mother will agree.   3) No further neurology recommendations.     Felicie Morn PA-C [facility] [phone]  12/04/2011, 4:10 PM

## 2011-12-04 NOTE — Progress Notes (Signed)
Patient ID: Bryce Leon, male   DOB: 03-Aug-1991, 21 y.o.   MRN: [id number]  Assessment/Plan:   Principal Problem:   *Acute psychosis  - unclear etiology perhaps due to substance abuse, HSV infection or acute bipolar disorder/schizophrenia  - psych consult follow up - aitvan and haldol PRN  - patient's mother very resistant to treatment for possible schizophrenia for which reason no medication has been started at this time - I will continue treatment with ceftriaxone for now and will observe for next 1-2 days if his mental status improves  Active Problems:   Hypertension  - started labetalol 200 mg BID   Altered mental status   - follow up with psych recommendations  - follow up MRI brain - negative - follow up EEG  - neurology following   Leukocytosis  - improved, has received 1 dose of acyclovir and 2nd day ceftriaxone  - perhaps this is related to HSV infection although withdrawal from substance abuse seems more likely or new onset schizophrenia; patient's mother is very resistant to any potential treatment for possible schizophrenia for which reason I have not started any medication - continue ceftriaxone  - follow up MRI brain - negative  EDUCATION  - test results and diagnostic studies were discussed with patient and pt's family who was present at the bedside  - patient and family have verbalized the understanding  - questions were answered at the bedside and contact information was provided for additional questions or concerns   Subjective: No events overnight. Patient denies chest pain, shortness of breath, abdominal pain.   Objective:  Vital signs in last 24 hours:  Filed Vitals:   12/03/11 1255 12/03/11 1700 12/04/11 0929 12/04/11 1340  BP: 176/76 149/91 147/96 126/77  Pulse: 94 90 101 92  Temp:  98.9 F (37.2 C)    TempSrc: Oral Oral  Oral  Resp: 19 20  18   Height:      Weight:      SpO2: 96% 99%  100%    Intake/Output from previous  day:   Intake/Output Summary (Last 24 hours) at 12/04/11 1416 Last data filed at 12/04/11 1300  Gross per 24 hour  Intake    600 ml  Output   1600 ml  Net  -1000 ml    Physical Exam: General: no acute distress; still disoriented HEENT: No bruits, no goiter. Moist mucous membranes, no scleral icterus, no conjunctival pallor. Heart: Regular rate and rhythm, S1/S2 +, no murmurs, rubs, gallops. Lungs: Clear to auscultation bilaterally. No wheezing, no rhonchi, no rales.  Abdomen: Soft, nontender, nondistended, positive bowel sounds. Extremities: No clubbing or cyanosis, no pitting edema,  positive pedal pulses. Neuro: Grossly nonfocal.  Lab Results:  Lab 12/04/11 0450 12/02/11 2010 12/01/11 0425  WBC 9.9 13.1* 21.3*  HGB 16.7 16.5 16.0  HCT 45.6 45.4 43.8  PLT 162 181 172  MCV 84.4 84.4 85.4    Lab 12/04/11 0450 12/02/11 2010 12/01/11 0425  NA 139 136 140  K 3.7 3.6 3.9  CL 102 100 102  CO2 26 28 23   GLUCOSE 101* 101* 92  BUN 9 9 11   CREATININE 0.92 0.95 0.90  CALCIUM 9.6 9.6 10.0    Cardiac markers:  Lab 12/02/11 1436 12/01/11 0425  CKMB 6.1* 8.3*  TROPONINI <0.30 --   No components found with this basename: POCBNP:3 Recent Results (from the past 240 hour(s))  CSF CULTURE     Status: Normal (Preliminary result)   Collection Time   12/02/11  11:35 PM      Component Value Range Status Comment   Specimen Description CSF   Final    Special Requests NONE   Final    Gram Stain     Final    Value: CYTOSPIN WBC NO ORGANISMS SEEN     Gram Stain Report Called to,Read Back By and Verified With: Gram Stain Report Called to,Read Back By and Verified With: BOUFFARD D/ED 0142 12/03/11 BY KARCZEWSKI S Performed by [facility]   Culture NO GROWTH 1 DAY   Final    Report Status PENDING   Incomplete   GRAM STAIN     Status: Normal   Collection Time   12-Dec-2011 11:35 PM      Component Value Range Status Comment   Specimen Description CSF   Final    Special Requests NONE    Final    Gram Stain     Final    Value: NO ORGANISMS SEEN     WBC PRESENT     CYTOSPIN SMEAR     CALLED TO BOUFFARD,D/ED @0142  ON 12/03/11 BY KARCZEWSKI,S.   Report Status 12/03/2011 FINAL   Final     Studies/Results: Dg Chest 2 View December 12, 2011    IMPRESSION: No acute cardiopulmonary findings.    Ct Head Wo Contrast 12-12-2011  IMPRESSION:  1.  No acute intracranial findings or mass lesion. 2.  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 Recommend follow-up CT scan in 6 months.    [name] Wo Contrast 12/03/2011 IMPRESSION: Negative MRI of the brain.  I do not think there is a colloid cyst but think the density at CT relates to a normal choroid.  See above.     Medications: Scheduled Meds:   . cefTRIAXone (ROCEPHIN)  IV  2 g Intravenous Q24H  . enoxaparin  40 mg Subcutaneous Q24H  . haloperidol lactate      . labetalol  200 mg Oral BID  . labetalol  5 mg Intravenous Once  . LORazepam  1 mg Intravenous Once  . sodium chloride  3 mL Intravenous Q12H   Continuous Infusions:   . dextrose 5 % and 0.9% NaCl       LOS: 3 days   Sumner Kirchman 12/04/2011, 2:16 PM  [facility] Pager: [phone]

## 2011-12-05 DIAGNOSIS — IMO0002 Reserved for concepts with insufficient information to code with codable children: Secondary | ICD-10-CM

## 2011-12-05 DIAGNOSIS — F29 Unspecified psychosis not due to a substance or known physiological condition: Principal | ICD-10-CM

## 2011-12-05 NOTE — Consult Note (Signed)
Reason for Consult:Acute Psychosis Referring Physician: Dr. Elisabeth Pigeon.  Bryce Leon is an 21 y.o. male.  HPI: The patient is a 21 y/o male with a past psychiatric history significant for Cannabis Dependence.  He was involuntarily committed after a psychotic episode when he inhaled marijuana.  He admits to being a chronic user of marijuana for the past 6 years, but he and his girlfriend (of the past 1.5 years) deny any reactions similar to this. He denies any complaints today.  With his verbal consent, his girlfriend provides collateral information.   Social History:  reports that he has been smoking.  He does not have any smokeless tobacco history on file. He reports that he drinks alcohol. He reports that he uses illicit drugs (Marijuana).  Allergies: No Known Allergies  Medications: I have reviewed the patient's current medications. Current facility-administered medications:acetaminophen (TYLENOL) tablet 650 mg, 650 mg, Oral, Q4H PRN, Shaaron Adler, PA-C, 650 mg at 12/02/11 1008;  dextrose 5 %-0.9 % sodium chloride infusion, , Intravenous, Continuous, Houston Siren, MD;  enoxaparin (LOVENOX) injection 40 mg, 40 mg, Subcutaneous, Q24H, Houston Siren, MD, 40 mg at 12/06/11 1012 haloperidol lactate (HALDOL) injection 1 mg, 1 mg, Intravenous, Q6H PRN, Alison Murray, MD, 1 mg at 12/04/11 1046;  labetalol (NORMODYNE) tablet 200 mg, 200 mg, Oral, BID, Alison Murray, MD, 200 mg at 12/06/11 1012;  LORazepam (ATIVAN) injection 2 mg, 2 mg, Intravenous, Q4H PRN, Alison Murray, MD, 2 mg at 12/05/11 0815;  ondansetron [facility]) injection 4 mg, 4 mg, Intravenous, Q6H PRN, Houston Siren, MD, 4 mg at 12/05/11 0945 ondansetron (ZOFRAN) tablet 4 mg, 4 mg, Oral, Q6H PRN, Houston Siren, MD;  sodium chloride 0.9 % injection 3 mL, 3 mL, Intravenous, Q12H, Houston Siren, MD, 3 mL at 12/06/11 1000   Results for orders placed during the hospital encounter of 12/01/11 (from the past 48 hour(s))  CBC     Status: Abnormal   Collection  Time   12/04/11  4:50 AM      Component Value Range Comment   WBC 9.9  4.0 - 10.5 (K/uL)    RBC 5.40  4.22 - 5.81 (MIL/uL)    Hemoglobin 16.7  13.0 - 17.0 (g/dL)    HCT 78.2  95.6 - 21.3 (%)    MCV 84.4  78.0 - 100.0 (fL)    MCH 30.9  26.0 - 34.0 (pg)    MCHC 36.6 (*) 30.0 - 36.0 (g/dL)    RDW 08.6  57.8 - 46.9 (%)    Platelets 162  150 - 400 (K/uL)   BASIC METABOLIC PANEL     Status: Abnormal   Collection Time   12/04/11  4:50 AM      Component Value Range Comment   Sodium 139  135 - 145 (mEq/L)    Potassium 3.7  3.5 - 5.1 (mEq/L)    Chloride 102  96 - 112 (mEq/L)    CO2 26  19 - 32 (mEq/L)    Glucose, Bld 101 (*) 70 - 99 (mg/dL)    BUN 9  6 - 23 (mg/dL)    Creatinine, Ser 6.29  0.50 - 1.35 (mg/dL)    Calcium 9.6  8.4 - 10.5 (mg/dL)    GFR calc non Af Amer >90  >90 (mL/min)    GFR calc Af Amer >90  >90 (mL/min)     No results found.  Review of Systems  Constitutional: Negative for fever and weight loss.  Respiratory: Negative for cough,  shortness of breath and wheezing.   Cardiovascular: Negative for chest pain.  Gastrointestinal: Negative for nausea and vomiting.  Neurological: Negative for headaches.  Psychiatric/Behavioral: Positive for hallucinations, memory loss and substance abuse. Negative for depression. The patient is nervous/anxious.    Blood pressure 129/75, pulse 87, temperature 97.7 F (36.5 C), temperature source Oral, resp. rate 18, height 6' (1.829 m), weight 81.5 kg (179 lb 10.8 oz), SpO2 99.00%. Physical Exam  Vitals reviewed. Constitutional: He appears well-developed and well-nourished. No distress.  Skin: He is not diaphoretic.     12/05/11 1411  BP: 137/78  Pulse: 65  Temp: 98 F (36.7 C)  TempSrc: Oral  Resp: 16  Height:   Weight:   SpO2: 100%  ' MENTAL STATUS EXAM:AND CONSCIOUSNESS: Attention appears to be waxing and waning APPEARANCE AND BEHAVIOR: Calm, disheveled  SPEECH: increased latency of response LANGUAGE: intact MOOD AND  AFFECT: "pretty good" affect is blunted PERCEPTUAL DISTURBANCE (hallucinations, illusions): denies auditory or visual hallucinations- however he appeared to be responding to external stimuli, and his girlfriend endorses the same.  THOUGHT PROCESS AND ASSOCIATION:  loosening of associations  SUICIDAL OR VIOLENT IDEATION: Patient denies suicidal or homicidal ideations, intent, or plans  INSIGHT: poor JUDGMENT:  impaired  MEMORY:  Impaired recent and remote FUND OF KNOWLEDGE Average    ASSESSMENT: Axis I: Substance Induced Psychotic Disorder, Cannabis Dependence Axis II: No diagnosis Axis III: No diagnosis Axis UX:[id number] Psychosocial Stressors:Enabling by parent Axis V GAF Score (current level of functioning): 30  PLAN: 1. Patient is not endorsing suicidal or homicidal ideation, intent or plan but has continued evidence of psychosis which warrants further treatment with follow up care for substance dependence. 2. He continues to warrant emergency commitment due to psychosis. 3 . Recommend inpatient psychiatric treatment when patient is medically stable.   Adelynne Joerger 12/05/2011, 7:06 PM

## 2011-12-05 NOTE — Consult Note (Signed)
Reason for Consult Acute Psychosis  Polysubstance Abise Referring Physician: Dr. [name] is an 21 y.o. male.  HPI: Bryce Leon is an 21 y.o. male with polysubstance abuse (tobacco, alcohol, marijuana) admitted to behavioral health inpatient with IVC, for mental status change. He is not able to give a complete history to me, so history was obtained through medical records, treatment team physician and staff , and partly from the patient himself. There was an acute change when he become quite agitated, combative, and paranoia. He was in 4 point restraint, and admitted IVC. He had some excoriation and contusion on his neck fighting against staff. Original workup included marked leukocytosis with white count of 21,000, CPK of 1600, UDS positive for marijuana and benzodiazepine. He was felt to be psychotic secondary to marijuana use, and was assessed serially by the psychiatrist. There were reports where he was better, confirmed by his girlfriend that he was at baseline. His mental status however waxes and wanes, and still, he is in restraints. 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   AXIS I Acute Psychosis, Cannabis induced, r/o Schizophrenia, Polysubstance abuse  AXIS II Deferred  AXIS III History reviewed. No pertinent past medical history.  History reviewed. No pertinent past surgical history.  History reviewed. No pertinent family history.  Social History:  reports that he has been smoking.  He does not have any smokeless tobacco history on file. He reports that he drinks alcohol. He reports that he uses illicit drugs (Marijuana).  Allergies: No Known Allergies  Medications: I have reviewed the patient's current medications.  Results for orders placed during the hospital encounter of 12/01/11 (from the past 48 hour(s))  CBC      Status: Abnormal   Collection Time   12/04/11  4:50 AM      Component Value Range Comment   WBC 9.9  4.0 - 10.5 (K/uL)    RBC 5.40  4.22 - 5.81 (MIL/uL)    Hemoglobin 16.7  13.0 - 17.0 (g/dL)    HCT 16.1  09.6 - 04.5 (%)    MCV 84.4  78.0 - 100.0 (fL)    MCH 30.9  26.0 - 34.0 (pg)    MCHC 36.6 (*) 30.0 - 36.0 (g/dL)    RDW 40.9  81.1 - 91.4 (%)    Platelets 162  150 - 400 (K/uL)   BASIC METABOLIC PANEL     Status: Abnormal   Collection Time   12/04/11  4:50 AM      Component Value Range Comment   Sodium 139  135 - 145 (mEq/L)    Potassium 3.7  3.5 - 5.1 (mEq/L)    Chloride 102  96 - 112 (mEq/L)    CO2 26  19 - 32 (mEq/L)    Glucose, Bld 101 (*) 70 - 99 (mg/dL)    BUN 9  6 - 23 (mg/dL)    Creatinine, Ser 7.82  0.50 - 1.35 (mg/dL)    Calcium 9.6  8.4 - 10.5 (mg/dL)    GFR calc non Af Amer >90  >90 (mL/min)    GFR calc Af Amer >90  >90 (mL/min)     [name] Wo Contrast  12/03/2011  *RADIOLOGY REPORT*  Clinical Data: Altered mental status.  Substance abuse.  Abnormal head CT.  MRI HEAD WITHOUT AND WITH  CONTRAST  Technique:  Multiplanar, multiecho pulse sequences of the brain and surrounding structures were obtained according to standard protocol without and with intravenous contrast  Contrast: 16mL MULTIHANCE GADOBENATE DIMEGLUMINE 529 MG/ML IV SOLN  Comparison: Head CT 12/02/2011  Findings: The brain has a normal appearance on all pulse sequences without evidence of malformation, atrophy, old or acute infarction, mass lesion, hemorrhage, hydrocephalus or extra-axial collection. Density near the foramen of Monro at CT relates to normal choroid. T1 images post contrast could not be obtained because of patient agitation.  No pituitary mass.  No inflammatory sinus disease.  IMPRESSION: Negative MRI of the brain.  I do not think there is a colloid cyst but think the density at CT relates to a normal choroid.  See above.  Original Report Authenticated By: Thomasenia Sales, M.D.    Review of  Systems  Unable to perform ROS: other   Blood pressure 138/85, pulse 91, temperature 98.4 F (36.9 C), temperature source Axillary, resp. rate 20, height 6' (1.829 m), weight 81.5 kg (179 lb 10.8 oz), SpO2 100.00%. Physical Exam  Assessment/Plan: Chart reviewed, Discussed with Dr. Elisabeth Pigeon. Discussed pt care with Mother and Cleora Fleet ~ 4:45 pm 12/04/11 Pt is sitting up sharing side of bed with GF.  GF's mother, Pt's mother, father and maternal grandparents are present.  Bryce Leon [Maliq] is awake and alert, wears glasses.  He says he won't be here tomorrow.  Mother states that she believes he smoked synthetic marijuana and became psychotic.  She says she does not want her son labeled 'Schizophrenic' and drugged up with medications before he has been able to return to his normal self.  She becomes adamantly defensive about his invention.  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  Psycho-education about prodrome to Schizophrenia, treatment of this condition, action of SGA, FGA medications on Brain chemistry is described.  The purpose of getting adequate treatment for a documented diagnosis is important to avoid untoward consequences is explained.  Mother is calmer and is assured he will be under observation.  Any restraints he has received is due to his intermittent phases of agitation.  Medication is only given to help induce calm behavior to protect self and others from physical harm.  Notes are reviewed.  Based upon Megan's report to Dr. Elisabeth Pigeon, pt took LSD [?} if so that is even more evidence that he has an induced psychotic break. RECOMMENDATION  1. Continue to recommend IVC for complete treatment of his psychotic behavior 2. Consider transfer via IVC to [facility] or other appropriate psychiatric inpatient units when medically stable 3. Agree to continue order for EEG 4. Information increases that pt has been taking multiple mind-altering substances.  5. Will  follow pt.   Dillie Burandt 12/05/2011, 12:17 AM

## 2011-12-05 NOTE — Progress Notes (Signed)
CSW called referral in to [facility] who will review referral and get back with CSW re: bed availability.   Unice Bailey, LCSWA [phone]

## 2011-12-05 NOTE — Consult Note (Signed)
Note opened in error. Please refer to next note.   ROS  Physical Exam

## 2011-12-05 NOTE — Progress Notes (Signed)
Patient ID: Bryce Leon, male   DOB: 22-Feb-1991, 21 y.o.   MRN: [id number]  Assessment/Plan:   Principal Problem:   *Acute psychosis  - unclear etiology perhaps due to substance abuse,  acute bipolar disorder/schizophrenia/ psychosis  - psych consult follow up - to [facility] - aitvan and haldol PRN  - patient's mother very resistant to treatment for possible schizophrenia for which reason no medication has been started at this time  - plan to transfer to [facility] once bed available  Active Problems:   Hypertension  - started labetalol 200 mg BID   Altered mental status  - follow up with psych recommendations  - follow up MRI brain - negative  - neurology signe doff   Leukocytosis  - resolved - negative CSF - D/C ceftriaxone  EDUCATION  - test results and diagnostic studies were discussed with patient and pt's family who was present at the bedside  - patient and family have verbalized the understanding  - questions were answered at the bedside and contact information was provided for additional questions or concerns    Subjective: No events overnight. Patient denies chest pain, shortness of breath, abdominal pain.  Objective:  Vital signs in last 24 hours:  Filed Vitals:   12/04/11 1755 12/04/11 2201 12/05/11 0600 12/05/11 0856  BP: 155/82 138/85 138/79 134/90  Pulse: 98 91 63 114  Temp: 98.3 F (36.8 C) 98.4 F (36.9 C) 98.1 F (36.7 C) 98.1 F (36.7 C)  TempSrc: Axillary Axillary Axillary Oral  Resp: 15 20 18 20   Height:      Weight:      SpO2: 99% 100% 100% 99%    Intake/Output from previous day:   Intake/Output Summary (Last 24 hours) at 12/05/11 1023 Last data filed at 12/05/11 0857  Gross per 24 hour  Intake    240 ml  Output    900 ml  Net   -660 ml    Physical Exam: General: oriented to place, person and time HEENT: No bruits, no goiter. Moist mucous membranes, no scleral icterus, no conjunctival pallor. Heart: Regular rate and rhythm, S1/S2 +, no  murmurs, rubs, gallops. Lungs: Clear to auscultation bilaterally. No wheezing, no rhonchi, no rales.  Abdomen: Soft, nontender, nondistended, positive bowel sounds. Extremities: No clubbing or cyanosis, no pitting edema,  positive pedal pulses. Neuro: Grossly nonfocal.  Lab Results:  Lab 12/04/11 0450 12/02/11 2010 12/01/11 0425  WBC 9.9 13.1* 21.3*  HGB 16.7 16.5 16.0  HCT 45.6 45.4 43.8  PLT 162 181 172  MCV 84.4 84.4 85.4    Lab 12/04/11 0450 12/02/11 2010 12/01/11 0425  NA 139 136 140  K 3.7 3.6 3.9  CL 102 100 102  CO2 26 28 23   GLUCOSE 101* 101* 92  BUN 9 9 11   CREATININE 0.92 0.95 0.90  CALCIUM 9.6 9.6 10.0    Lab 12/02/11 1436 12/01/11 0425  CKMB 6.1* 8.3*  TROPONINI <0.30 --   CSF CULTURE     Status: Normal (Preliminary result)   Collection Time   12/02/11 11:35 PM      Component Value Range Status Comment   Specimen Description CSF   Final    Special Requests NONE   Final    Gram Stain     Final    Value: CYTOSPIN WBC NO ORGANISMS SEEN   Culture NO GROWTH 1 DAY   Final    Report Status PENDING   Incomplete   GRAM STAIN     Status:  Normal   Collection Time   12/02/11 11:35 PM      Component Value Range Status Comment   Specimen Description CSF   Final    Special Requests NONE   Final    Gram Stain     Final    Value: NO ORGANISMS SEEN     WBC PRESENT     CYTOSPIN SMEAR     CALLED TO BOUFFARD,D/ED @0142  ON 12/03/11 BY KARCZEWSKI,S.   Report Status 12/03/2011 FINAL   Final     Studies/Results: Mr Lodema Pilot Contrast 12/03/2011    IMPRESSION: Negative MRI of the brain.  I do not think there is a colloid cyst but think the density at CT relates to a normal choroid.  See above.    Medications: Scheduled Meds:   . enoxaparin  40 mg Subcutaneous Q24H  . labetalol  200 mg Oral BID  . sodium chloride  3 mL Intravenous Q12H   Continuous Infusions:   . dextrose 5 % and 0.9% NaCl     PRN Meds:.acetaminophen, haloperidol lactate, LORazepam, ondansetron  (ZOFRAN) IV, ondansetron    LOS: 4 days   Valita Righter 12/05/2011, 10:23 AM  [facility] Pager: [phone]

## 2011-12-05 NOTE — Progress Notes (Signed)
At approximately 0845 the pt became combative with sitter and security was called. Pt did not calm down even after being medicated with ativan. Heather the charge nurse paged the MD and received an order for four point restraints. Pt was placed in restraints and his mother came to see him and he has been calm while she has been here. There have been no issues with the pt in restraints.

## 2011-12-06 LAB — CSF CULTURE W GRAM STAIN
Culture: NO GROWTH
Gram Stain: NONE SEEN

## 2011-12-06 MED ORDER — HALOPERIDOL LACTATE 5 MG/ML IJ SOLN
1.0000 mg | Freq: Four times a day (QID) | INTRAMUSCULAR | Status: DC | PRN
  Administered 2011-12-06 – 2011-12-07 (×2): 1 mg via INTRAVENOUS
  Filled 2011-12-06: qty 1

## 2011-12-06 MED ORDER — OLANZAPINE 10 MG IM SOLR
10.0000 mg | INTRAMUSCULAR | Status: DC
  Filled 2011-12-06: qty 10

## 2011-12-06 MED ORDER — OLANZAPINE 10 MG IM SOLR: 10.0000 mg | INTRAMUSCULAR | Status: DC

## 2011-12-06 MED ORDER — DIPHENHYDRAMINE HCL 50 MG/ML IJ SOLN
INTRAMUSCULAR | Status: AC
  Filled 2011-12-06: qty 1

## 2011-12-06 MED ORDER — DIPHENHYDRAMINE HCL 50 MG/ML IJ SOLN: 50.0000 mg | Freq: Once | INTRAMUSCULAR | Status: DC

## 2011-12-06 MED ORDER — HALOPERIDOL LACTATE 5 MG/ML IJ SOLN
5.0000 mg | Freq: Once | INTRAMUSCULAR | Status: AC
  Administered 2011-12-06: 5 mg via INTRAMUSCULAR

## 2011-12-06 NOTE — Progress Notes (Signed)
Pt laying calmly in bed. Girlfriend on couch at bedside. Sitter in room. Assessment for restraints were completed and pt understood criteria for taking the restraints off. Pt verbalized understanding and agreed to comply with safety rules. Restraints were removed and pt began active range of motion as well as standing at bedside and hugging his girlfriend. Pt sat on couch calmly with girlfriend. No issues with behavior or verbal aggressiveness.

## 2011-12-06 NOTE — Progress Notes (Signed)
Pt sleeping in bed. Sitter at bedside.  No behavior issues or verbal aggressiveness this shift. Pt has been calm and cooperative.

## 2011-12-06 NOTE — Progress Notes (Signed)
Pt started opening and shutting the window and the sitter asked him to stop repeatedly and he did not. I went in and told him that it is hospital policy that the windows be shut and he shut the window and left it alone until his mother came to visit and he started acting up. I reminded him that his behavior could get him put back in restraints and he stopped.
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
## 2011-12-06 NOTE — Progress Notes (Signed)
Patient ID: Bryce Leon, male   DOB: [birth year], 21 y.o.   MRN: [id number]  Assessment/Plan:   Principal Problem:   *Acute psychosis  - unclear etiology perhaps due to substance abuse, acute bipolar disorder/schizophrenia/ psychosis  - psych consult follow up - to [facility]  - aitvan and haldol PRN  - patient's mother very resistant to treatment for possible schizophrenia for which reason no medication has been started at this time  - plan to transfer to [facility] once bed available   Active Problems:   Hypertension  - started labetalol 200 mg BID   Altered mental status  - follow up with psych recommendations  - follow up MRI brain - negative  - neurology signe doff   Leukocytosis  - resolved  - negative CSF  - D/Ced ceftriaxone   EDUCATION  - test results and diagnostic studies were discussed with patient and pt's family who was present at the bedside  - patient and family have verbalized the understanding  - questions were answered at the bedside and contact information was provided for additional questions or concerns    Subjective: No events overnight. Patient denies chest pain, shortness of breath, abdominal pain. Had bowel movement and reports ambulating.  Objective:  Vital signs in last 24 hours:  Filed Vitals:   12/05/11 1857 12/05/11 2141 12/06/11 0516 12/06/11 1438  BP: 129/75 132/85 133/79 151/84  Pulse: 87 67 62 81  Temp: 97.7 F (36.5 C) 98.1 F (36.7 C) 97.9 F (36.6 C) 98.1 F (36.7 C)  TempSrc: Oral Axillary Axillary Oral  Resp: 18 20 18 16   Height:      Weight:      SpO2: 99% 96% 100% 100%    Intake/Output from previous day:   Intake/Output Summary (Last 24 hours) at 12/06/11 1606 Last data filed at 12/06/11 1220  Gross per 24 hour  Intake   1440 ml  Output    650 ml  Net    790 ml    Physical Exam: General: Alert, awake, oriented x3, in no acute distress. HEENT: No bruits, no goiter. Moist mucous membranes, no scleral icterus, no  conjunctival pallor. Heart: Regular rate and rhythm, S1/S2 +, no murmurs, rubs, gallops. Lungs: Clear to auscultation bilaterally. No wheezing, no rhonchi, no rales.  Abdomen: Soft, nontender, nondistended, positive bowel sounds. Extremities: No clubbing or cyanosis, no pitting edema,  positive pedal pulses. Neuro: Grossly nonfocal.  Lab Results:   Lab 12/04/11 0450 12/02/11 2010 12/01/11 0425  WBC 9.9 13.1* 21.3*  HGB 16.7 16.5 16.0  HCT 45.6 45.4 43.8  PLT 162 181 172  MCV 84.4 84.4 85.4  MCH 30.9 30.7 31.2  MCHC 36.6* 36.3* 36.5*  RDW 12.4 12.2 12.3  LYMPHSABS -- 2.0 --  MONOABS -- 0.9 --  EOSABS -- 0.1 --  BASOSABS -- 0.0 --  BANDABS -- -- --    Lab 12/04/11 0450 12/02/11 2010 12/01/11 0425  NA 139 136 140  K 3.7 3.6 3.9  CL 102 100 102  CO2 26 28 23   GLUCOSE 101* 101* 92  BUN 9 9 11   CREATININE 0.92 0.95 0.90  CALCIUM 9.6 9.6 10.0  MG -- -- --   No results found for this basename: INR:5,PROTIME:5 in the last 168 hours Cardiac markers:  Lab 12/02/11 1436 12/01/11 0425  CKMB 6.1* 8.3*  TROPONINI <0.30 --  MYOGLOBIN -- --   No components found with this basename: POCBNP:3 Recent Results (from the past 240 hour(s))  CSF CULTURE  Status: Normal   Collection Time   12/02/11 11:35 PM      Component Value Range Status Comment   Specimen Description CSF   Final    Special Requests NONE   Final    Gram Stain     Final    Value: CYTOSPIN WBC NO ORGANISMS SEEN     Gram Stain Report Called to,Read Back By and Verified With: Gram Stain Report Called to,Read Back By and Verified With: BOUFFARD D/ED 0142 12/03/11 BY Governor Specking S Performed by [facility]   Culture NO GROWTH 3 DAYS   Final    Report Status 12/06/2011 FINAL   Final   GRAM STAIN     Status: Normal   Collection Time   12/02/11 11:35 PM      Component Value Range Status Comment   Specimen Description CSF   Final    Special Requests NONE   Final    Gram Stain     Final    Value: NO ORGANISMS SEEN       WBC PRESENT     CYTOSPIN SMEAR     CALLED TO BOUFFARD,D/ED @0142  ON 12/03/11 BY KARCZEWSKI,S.   Report Status 12/03/2011 FINAL   Final     Medications: Scheduled Meds:   . enoxaparin  40 mg Subcutaneous Q24H  . labetalol  200 mg Oral BID  . sodium chloride  3 mL Intravenous Q12H   Continuous Infusions:   . dextrose 5 % and 0.9% NaCl     PRN Meds:.acetaminophen, haloperidol lactate, LORazepam, ondansetron (ZOFRAN) IV, ondansetron   LOS: 5 days   Lota Leamer 12/06/2011, 4:06 PM  [facility] Pager: [phone]

## 2011-12-06 NOTE — ED Provider Notes (Signed)
Evaluation and management procedures were performed by the PA/NP/Resident Physician under my supervision/collaboration.   Satoya Feeley D Destani Wamser, MD 12/06/11 1601 

## 2011-12-06 NOTE — Progress Notes (Signed)
The pt had a room full of company and he pulled the code button on the wall. The secretary called in his room and told him not to pull that button anymore and explained that it caused a lot of people to come to his room thinking he needs help. A minute later he pulled it again and the code was called and we had in cancelled. The [facility] came and told him not to pull that button again because it caused a lot of people to come up here that could be helping someone that really needed help and I went in with the [facility] and noticed that the window was open again and I told him mother to shut the window and explained that it is hospital policy.

## 2011-12-06 NOTE — Progress Notes (Signed)
CSW assisting with d/c planning. No availability at [facility] at the moment. Thom at [facility] will contact CSW when there is an opening.   Cori Razor  LCSW [phone]  ( week end coverage )

## 2011-12-07 ENCOUNTER — Inpatient Hospital Stay (HOSPITAL_COMMUNITY)
Admit: 2011-12-07 | Discharge: 2011-12-07 | Disposition: A | Discharge status: Home / Self Care | Payer: BC Managed Care – PPO | Attending: Internal Medicine | Admitting: Internal Medicine

## 2011-12-07 DIAGNOSIS — F1220 Cannabis dependence, uncomplicated: Secondary | ICD-10-CM

## 2011-12-07 DIAGNOSIS — F1994 Other psychoactive substance use, unspecified with psychoactive substance-induced mood disorder: Secondary | ICD-10-CM

## 2011-12-07 DIAGNOSIS — R569 Unspecified convulsions: Secondary | ICD-10-CM

## 2011-12-07 DIAGNOSIS — F1910 Other psychoactive substance abuse, uncomplicated: Secondary | ICD-10-CM

## 2011-12-07 LAB — HSV(HERPES SMPLX VRS)ABS-I+II(IGG)-CSF

## 2011-12-07 MED ORDER — HALOPERIDOL LACTATE 5 MG/ML IJ SOLN
1.0000 mg | Freq: Four times a day (QID) | INTRAMUSCULAR | Status: DC | PRN
  Administered 2011-12-07 – 2011-12-08 (×2): 1 mg via INTRAMUSCULAR
  Filled 2011-12-07 (×2): qty 1

## 2011-12-07 MED ORDER — LORAZEPAM 2 MG/ML IJ SOLN
2.0000 mg | INTRAMUSCULAR | Status: DC | PRN
  Administered 2011-12-07 – 2011-12-08 (×5): 2 mg via INTRAMUSCULAR
  Filled 2011-12-07 (×5): qty 1

## 2011-12-07 NOTE — Discharge Summary (Signed)
Patient ID: Bryce Leon MRN: [id number] DOB/AGE: 18-Feb-1991 21 y.o.  Admit date: 12/01/2011 Discharge date: 12/07/2011  Primary Care Physician:  No primary provider on file.  Assessment/Plan:   Principal Problem:   *Acute psychosis  - unclear etiology perhaps due to substance abuse, acute bipolar disorder/schizophrenia/ psychosis  - psych consult follow up - to [facility]  - aitvan and haldol PRN  - we will defer all potential psychiatric medications recommendations to psychiatrist at [facility]  Active Problems:   Hypertension  - started labetalol 200 mg BID but he does not to be continued on this medication as his BP under relatively good control  Altered mental status  - follow up with psych recommendations  - follow up MRI brain - negative  - neurology signed off   Leukocytosis  - resolved  - negative CSF  - D/Ced ceftriaxone as there was no evidence of CNS infection  EDUCATION  - test results and diagnostic studies were discussed with patient and pt's family who was present at the bedside  - patient and family have verbalized the understanding  - questions were answered at the bedside and contact information was provided for additional questions or concerns    Medication List     You have not been prescribed any medications.     Disposition and Follow-up:  - patient is medically stable to be discharged to [facility]  Consults:   1. Psychiatry 2. Neurology  Significant Diagnostic Studies:  CT Head  1. No acute intracranial findings or mass lesion. 2. 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 Recommend follow-up CT scan in 6 months.  MRI brain w/o contrast Negative MRI of the brain. I do not think there is a colloid cyst but think the density at CT relates to a normal choroid. See above.  Brief H and P: 21 year old male who has a history of substance abuse including but not limited to marijuana, LSD, benzodiazepines who presented to ED with acute onset mental  status changes. Initially it was thought that the patient may have had HSV encephalitis and while his laboratory work up normalized his mental status did not significantly change. This required regular follow up by psychiatrist who determined that the patient is appropriate for IVC.  Physical Exam on Discharge:  Filed Vitals:   12/06/11 2104 12/06/11 2200 12/07/11 0616 12/07/11 0922  BP: 122/73 140/73 114/53 116/54  Temp: 98.1 F (36.7 C)  97.3 F (36.3 C) 98.6 F (37 C)  TempSrc: Axillary  Axillary Oral  Resp: 18  16 16   SpO2: 100%  98% 100%     Gross per 24 hour  Intake    480 ml  Output      0 ml  Net    480 ml    General:  no acute distress; patient is oriented at this time HEENT: No bruits, no goiter. Heart: Regular rate and rhythm, without murmurs, rubs, gallops. Lungs: Clear to auscultation bilaterally. Abdomen: Soft, nontender, nondistended, positive bowel sounds. Extremities: No clubbing cyanosis or edema with positive pedal pulses. Neuro: Grossly intact, nonfocal.  CBC:    Component Value Date/Time   WBC 9.9 12/04/2011 0450   HGB 16.7 12/04/2011 0450   HCT 45.6 12/04/2011 0450   PLT 162 12/04/2011 0450   MCV 84.4 12/04/2011 0450    Basic Metabolic Panel:    Component Value Date/Time   NA 139 12/04/2011 0450   K 3.7 12/04/2011 0450   CL 102 12/04/2011 0450  CO2 26 12/04/2011 0450   BUN 9 12/04/2011 0450   CREATININE 0.92 12/04/2011 0450   GLUCOSE 101* 12/04/2011 0450   CALCIUM 9.6 12/04/2011 0450    Time spent on Discharge: Greater than 30 minutes  Signed: Kynnedy Carreno 12/07/2011, 3:26 PM

## 2011-12-07 NOTE — Consult Note (Signed)
Reason for Consult:Acute Psychosis, Very Aggressive Referring Physician: Dr. Janyce Leon is an 21 y.o. male.  HPI: Bryce Leon is an 21 y.o. male with polysubstance abuse (tobacco, alcohol, marijuana) admitted to behavioral health inpatient with IVC, for mental status change. He is not able to give a complete history to me, so history was obtained through medical records, treatment team physician and staff , and partly from the patient himself. There was an acute change when he become quite agitated, combative, and paranoia. He was in 4 point restraint, and admitted IVC. He had some excoriation and contusion on his neck fighting against staff. Original workup included marked leukocytosis with white count of 21,000, CPK of 1600, UDS positive for marijuana and benzodiazepine. He was felt to be psychotic secondary to marijuana use, and was assessed serially by the psychiatrist. There were reports where he was better, confirmed by his girlfriend that he was at baseline. His mental status however waxes and wanes, and still, he intermittently in restraints. 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 Today he became very agitated when tech was attempting to perform EEG; attendants were required to reassure and keep pt calm.  He has periods where he is lucid and cognitively intact and spontaneously he becomes very agitated, then calm again.   AXIS I Acute Psychosis, Cannabis induced, r/o Schizophrenia, Polysubstance abuse  AXIS II Deferred  AXIS III History reviewed. No pertinent past medical history.  History reviewed. No pertinent past surgical history. AXIS IV Supportive family, hx of drug abuse duration unknown, unstable affect AXIS V  GAF  45   History reviewed. No pertinent family history.  Social History:  reports that he has been smoking.  He  does not have any smokeless tobacco history on file. He reports that he drinks alcohol. He reports that he uses illicit drugs (Marijuana).  Allergies: No Known Allergies  Medications: I have reviewed the patient's current medications.  No results found for this or any previous visit (from the past 48 hour(s)).  No results found.  Review of Systems  Unable to perform ROS  Blood pressure 128/82, pulse 90, temperature 97.8 F (36.6 C), temperature source Oral, resp. rate 16, height 6' (1.829 m), weight 81.5 kg (179 lb 10.8 oz), SpO2 100.00%. Physical Exam  Assessment/Plan:  Chart reviewed  Discussed with Dr. Elisabeth Pigeon and Psych CSW Pt has psychosis of unknown etiology, presumed to be drug induced with need to rule out psychosis de novo.  Discussion with family members, GF and pt discloses that pt has tried lots of drugs and has a history of serious alcohol abuse, duration unable to establish.  Mother is very determined that her son not be labled as 'mentally ill'.  She was receptive to education about psychotic episodes at her son's age.   He continues to have periods of mood decompensation; then regains composure.  EEG is done today, not without a lot of cajoling.  He had a very disruptive night with serial oppositional behavior and was noted to be talking to his GF when she was not there.  He needs extended observation to determine if he can regain and sustain his cognitive ability.  It is not know what specific drug[s] and to what extent these have had on his mental status.  It is anticipated that further observation will help differentiate  his cognitive/mental status and determine if medication[s] are needed to stabilize his altered mental status.  EEG report is pending  All other MRI, EKG has been normal.  Medication has been necessary to regulate his blood pressure.  RECOMMENDATION  1.Continue IVC 2. Transfer to [facility] when medically stable.  Call is made to [facility] 3. No further psychiatric needs  identified.  MD Psychiatrist signs off  Bryce Leon 12/07/2011, 4:53 PM

## 2011-12-07 NOTE — Progress Notes (Addendum)
Spoke with [employer] regarding if bed was available at [facility] for transfer.  She stated no bed available at this time will notify me as soon as one ready for patient

## 2011-12-07 NOTE — Progress Notes (Signed)
Spoke with Nevada Crane, CSW regarding bed availability at [facility], she will followup and let me know

## 2011-12-07 NOTE — Progress Notes (Signed)
Bryce Leon, CSW notified me that Bryce Leon has declined acceptance of Bryce Leon due to his acuity.  He will need placement else where possibly [facility].   Voice message left for Bryce Leon, CSW regarding above information

## 2011-12-07 NOTE — Progress Notes (Signed)
Attempted to meet with Pt.  Pt just given medicine to help calm him.  Pt sleeping quietly.  Per RN, Pt restless today and somewhat non-compliant.  Per [facility], no beds available, at this time.  CSW to continue to follow.  Providence Crosby, LCSWA Clinical Social Work [phone]

## 2011-12-07 NOTE — Progress Notes (Signed)
ED CSW was contacted by RN Aram Beecham to review with [facility] if the pt has been accepted. Per Fulton Mole at [facility], pt has been declined and will need a [facility] referral. Inpatient CSW to complete this process.

## 2011-12-07 NOTE — Discharge Instructions (Signed)
Altered Mental Status Altered mental status most often refers to an abnormal change in your responsiveness and awareness. It can affect your speech, thought, mobility, memory, attention span, or alertness. It can range from slight confusion to complete unresponsiveness (coma). Altered mental status can be a sign of a serious underlying medical condition. Rapid evaluation and medical treatment is necessary for patients having an altered mental status. CAUSES   Low blood sugar (hypoglycemia) or diabetes.   Severe loss of body fluids (dehydration) or a body salt (electrolyte) imbalance.   A stroke or other neurologic problem, such as dementia or delirium.   A head injury or tumor.   A drug or alcohol overdose.   Exposure to toxins or poisons.   Depression, anxiety, and stress.   A low oxygen level (hypoxia).   An infection.   Blood loss.   Twitching or shaking (seizure).   Heart problems, such as heart attack or heart rhythm problems (arrhythmias).   A body temperature that is too low or too high (hypothermia or hyperthermia).  DIAGNOSIS  A diagnosis is based on your history, symptoms, physical and neurologic examinations, and diagnostic tests. Diagnostic tests may include:  Measurement of your blood pressure, pulse, breathing, and oxygen levels (vital signs).   Blood tests.   Urine tests.   X-ray exams.   A computerized magnetic scan (magnetic resonance imaging, MRI).   A computerized X-ray scan (computed tomography, CT scan).  TREATMENT  Treatment will depend on the cause. Treatment may include:  Management of an underlying medical or mental health condition.   Critical care or support in the hospital.  HOME CARE INSTRUCTIONS   Only take over-the-counter or prescription medicines for pain, discomfort, or fever as directed by your caregiver.   Manage underlying conditions as directed by your caregiver.   Eat a healthy, well-balanced diet to maintain strength.    Join a support group or prevention program to cope with the condition or trauma that caused the altered mental status. Ask your caregiver to help choose a program that works for you.   Follow up with your caregiver for further examination, therapy, or testing as directed.  SEEK MEDICAL CARE IF:   You feel unwell or have chills.   You or your family notice a change in your behavior or your alertness.   You have trouble following your caregiver's treatment plan.   You have questions or concerns.  SEEK IMMEDIATE MEDICAL CARE IF:   You have a rapid heartbeat or have chest pain.   You have difficulty breathing.   You have a fever.   You have a headache with a stiff neck.   You cough up blood.   You have blood in your urine or stool.   You have severe agitation or confusion.  MAKE SURE YOU:   Understand these instructions.   Will watch your condition.   Will get help right away if you are not doing well or get worse.  Document Released: 02/04/2010 Document Revised: 08/06/2011 Document Reviewed: 02/04/2010 [employer] Patient Information 2012 [employer]. 

## 2011-12-07 NOTE — Progress Notes (Signed)
Pt became restless and agitated right after change of shift and continued during morning.  He kept opening window and would not close it.  He finally allowed the sitter to close it.  He took calendar off wall and went to [employer] Day 2013.  He refused several time to give calendar back to me but finally did.  He threw up in bathroom per NT and he said he ate breakfast too fast.  He closed the door to bathroom on NT when she was trying to clean up emesis.  NT described emesis as looking like his breakfast.  NT also reported him laying on floor and he said he was looking at the bed.  He only stayed on the floor for a couple of minutes.  He wanted to go walking without red socks on feet.  I told him I would be in shortly to give medication and he said ok.  NT also reported him talking to his girlfriend who was not present.  He also mentioned something about the devil. I did not get clarification from patient.  Gave medication to help calm the patient down.  He is now comfortably resting.  Mom just arrived to visit patient.

## 2011-12-07 NOTE — Progress Notes (Signed)
Held labetalol this am b/c BP 116/54 and P59.  Did not feel comfortable giving medication even though no parameters were set for med.  Will notify MD.

## 2011-12-08 MED ORDER — LORAZEPAM 2 MG/ML IJ SOLN: 2.0000 mg | Freq: Four times a day (QID) | INTRAMUSCULAR | Status: DC | PRN

## 2011-12-08 MED ORDER — HALOPERIDOL LACTATE 5 MG/ML IJ SOLN
5.0000 mg | Freq: Four times a day (QID) | INTRAMUSCULAR | Status: DC | PRN
  Administered 2011-12-08 – 2011-12-09 (×3): 5 mg via INTRAMUSCULAR
  Filled 2011-12-08 (×3): qty 1

## 2011-12-08 MED ORDER — LORAZEPAM 2 MG/ML IJ SOLN
2.0000 mg | Freq: Once | INTRAMUSCULAR | Status: AC
  Administered 2011-12-08: 2 mg via INTRAMUSCULAR
  Filled 2011-12-08: qty 1

## 2011-12-08 NOTE — Progress Notes (Signed)
Pt still in restraints.  Pt intermittently goes between an angry & hitting the bed stage to a calm/resting state.

## 2011-12-08 NOTE — Progress Notes (Signed)
RN reports pt was attempting trial out of restraints when he began to wander about the unit not following instructions to return to his room. At one point attempted to get sitter onto elevator in an attempt to leave. Pt placed back in 4-point restraints (behavioural). Upon my assessment pt noted to be calm, cooperative w/ very blunt affect. BBS CTA, 4-point restraints appropriately placed. Pt currently in NAD. Will re-eval in 4 hrs per protocol.

## 2011-12-08 NOTE — Progress Notes (Signed)
Faxed Pt's information to [employer].  Providence Crosby, LCSWA Clinical Social Work [phone]

## 2011-12-08 NOTE — Consult Note (Signed)
Reason for Consult:altered Mental Status  Acute Psychosis Referring Physician: Dr. [name] is an 21 y.o. male.  Bryce Leon is an 21 y.o. male with polysubstance abuse (tobacco, alcohol, marijuana) admitted to behavioral health inpatient with IVC, for mental status change. He is not able to give a complete history to me, so history was obtained through medical records, treatment team physician and staff , and partly from the patient himself. There was an acute change when he become quite agitated, combative, and paranoia. He was in 4 point restraint, and admitted IVC. He had some excoriation and contusion on his neck fighting against staff. Original workup included marked leukocytosis with white count of 21,000, CPK of 1600, UDS positive for marijuana and benzodiazepine. He was felt to be psychotic secondary to marijuana use, and was assessed serially by the psychiatrist. There were reports where he was better, confirmed by his girlfriend that he was at baseline. His mental status however waxes and wanes, and still, he intermittently in restraints. 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 Today he became very agitated when tech was attempting to perform EEG; attendants were required to reassure and keep pt calm. He has periods where he is lucid and cognitively intact and spontaneously he becomes very agitated, then calm again. During the past 24 hrs he has been in restraints for his aggressive behavior and Medication given has brief effect.   AXIS I Acute Psychosis, Cannabis induced, r/o Schizophrenia, Polysubstance abuse  AXIS II Deferred  AXIS III History reviewed. No pertinent past medical history.  History reviewed. No pertinent past surgical history. AXIS IV Family support, unresolved psychosis AXIS V  GAF  35   History  reviewed. No pertinent family history.  Social History:  reports that he has been smoking.  He does not have any smokeless tobacco history on file. He reports that he drinks alcohol. He reports that he uses illicit drugs (Marijuana).  Allergies: No Known Allergies  Medications: I have reviewed the patient's current medications.  No results found for this or any previous visit (from the past 48 hour(s)).  No results found.  Review of Systems  Unable to perform ROS: other   Blood pressure 128/67, pulse 60, temperature 98.7 F (37.1 C), temperature source Oral, resp. rate 16, height 6' (1.829 m), weight 81.5 kg (179 lb 10.8 oz), SpO2 100.00%. Physical Exam  Assessment/Plan: Chart reviewed, Discussed with Dr. Elisabeth Pigeon, Psych CSW and RN Attempted to interview pt ~ 2:45 pm  12/08/11 Pt is in 2 pt restraints.  He appears drowsy but sits up.  He has a vacuous stare and begins berating and using denigrating language directed at MD.  He bangs on rails and struggles a bit with wrist restraints.  He uses teeth to pull tag on velcro to open restraint; staff closes them 3 times.  He is incapable of engaging in a conversational dialogue.  He has expressed delusions today that his GF is in the hospital and demands to call her.  He has a very paranoid theme to his rantings.  He is not cognitively intact.  RN has as needed medication to administer to keep him from becoming violent.  He does not respond to any request to cooperate or exhibit any understanding that were he to remain calm, the restraints will be removed.  He is devoid of insight and judgment. RECOMMENDATION 1.  Given observation and persistent agitation and aggression.  The frequent doses of SGA are not quelling his violent impulses.  It is suggested to increase the dose of Zyprexa to 15 mg Q 6 hrs or to 20 mg Q 12 hrs until he has a restful, cooperative period. Continued combativeness is not conducive to a fully oriented state.  2. Referral to [facility]  and to [facility] is appreciated. - will monitor status 3. Maintain IVC. 4. Will follow pt.   Bryce Leon 12/08/2011, 11:14 PM

## 2011-12-08 NOTE — Progress Notes (Signed)
Spoke with Pt with MD present re: placement situation.  State requesting EEG, petition and renewed IVC paperwork.  Obtained MD's signature on IVC paperwork.  Notified Magistrate that CSW was to fax IVC paperwork.  Faxed paperwork and confirmed with Magistrate receipt.  Faxed updated IVC paperwork, as well as EEG and Petition from 4/2 to [place] at [place].  Confirmed receipt of paperwork by Junious Dresser.  Per Junious Dresser, Pt is waiting to be reviewed medically by MD.  Per Junious Dresser, it is very likely that Pt will be moved to top of the wait list, if accepted.  Notified MD.  Providence Crosby, LCSWA Clinical Social Work [phone]

## 2011-12-08 NOTE — Progress Notes (Signed)
At 2130, increased restlessness, racially abusive(verbally) toward patient.  Able to redirect to room.  Patient appeared to be responding to internal stimuli during this time(paranoid, poor eye contact, poor insight) Haldol 1mg Horton Marshall 2mg  adm without incident.  Good relief noted slept for 6 hrs, resting quietly at present

## 2011-12-08 NOTE — Progress Notes (Signed)
Pt tried to call girlfriend on hospital call bell.  York Spaniel that she was here at work in the hospital.

## 2011-12-08 NOTE — Progress Notes (Signed)
Pt in restraints & still very agitated & angry.  He is shaking the bed & hitting the side of the bed.  He wants [place] to let him out of restraints and leave him alone to be with his girlfriend.  He says his girlfriend is in hospital but she is not in hospital at this time.

## 2011-12-08 NOTE — Progress Notes (Signed)
Patient now placed into four point restraints by security, doctor notified, [facility] notified due to patient's disruptive behavior

## 2011-12-08 NOTE — Progress Notes (Addendum)
Using [employer] on ankles and wrist, approved by [facility] and [place] Plan, RN. Doctor paged to see patient within one hour of start of restraints.

## 2011-12-08 NOTE — Progress Notes (Signed)
This am pt became very restless & agitated.  He came out into the hall & began to sway forward & backwards.  He would not cooperate to sit down for safety.  He finally did sit down in the chair & finally after many attempts to persuade to go in room. Security was called.  Instructed to not come in room yet to try to give medication first.  Had to call them in because he would not get to be and was sliding out of the chair.  Security assisted in getting patient back to bed. Administered medication.

## 2011-12-08 NOTE — Progress Notes (Addendum)
Patient attempting to leave unit, angry, agitated.  It took five staff members to return the patient to his room. Patient continues to be disruptive in his room by shaking bed physically, breaking his eye glasses by throwing them, verbally aggressive and abusive.  Security has been called and arrived on the unit.

## 2011-12-08 NOTE — Progress Notes (Signed)
Patient sitting in chair in middle of hallway refusing to return to room, security called to assist with returning patient to his room

## 2011-12-08 NOTE — Procedures (Signed)
EEG NUMBER:  [id number].  This routine EEG was requested in this 21 year old man who was admitted due to mental status changes.  He has a history of polysubstance abuse, and having a waxing and waning mental status.  Ativan was given during the tracing to keep the patient from pulling off the EEG electrodes.  MEDICATIONS:  Include Zyprexa Haldol and Ativan.  The EEG was done with the patient awake and drowsy.  During periods of maximal wakefulness, he had a 11 cycle per second posterior dominant rhythm that attenuated with eye opening and was symmetric.  Background activities were composed of low-amplitude beta activities that were symmetric.  Photic stimulation and hyperventilation were not performed.   The patient did become drowsy as characterized by the onset of diffuse slower delta and theta activities as well as an increase in amplitude of underlying beta activities, particularly in the midline and paramedian region. Vertex sharp waves that were symmetric were also seen.  The patient did not enter stage 2 sleep.  CLINICAL INTERPRETATION:  This routine EEG done with the patient awake and drowsy is normal.          ______________________________ Denton Meek, MD    ZO:[id number] D:  12/08/2011 07:23:57  T:  12/08/2011 07:48:48  Job #:  [id number]

## 2011-12-08 NOTE — Progress Notes (Signed)
Pt is very restless & angry trying to leave floor. Already given the medication that I could give to pt.  It calmed patient down for a short amount of time.  He is angry again & trying to leave.  He threw his glasses across the floor and one side of lens came out of glasses.  I have paged MD.
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
## 2011-12-08 NOTE — Progress Notes (Addendum)
Received Auth # from [place].  Spoke with Junious Dresser at [facility]) and asked that Pt's admission, should he be accepted, be expedited.  Discussed Pt's behaviors with Junious Dresser.  Faxed labs, legal papers, CBC, prog notes, psych consults, Chest Xray, EKG, vitals and MAR to [facility].  Confirmed receipt by Junious Dresser of fax.  Spoke with Pt.  Pt was medicated but able to answer some questions.  Discussed with Pt d/c plans.  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  CSW discussed the [facility] hospital and the reasons necessitating transfer to [facility].  Pt stated that he is in restraints because he asked them to put the restraints back on.  Pt stated that he is in the hospital because he smoked synthetic marijuana.  Discussed the behaviors that Pt exhibited to [place] State.  Pt stated that he understood.    Pt provided CSW with his mother's information: Susan--[phone], [phone].  CSW thanked Pt for his time.  Spoke with Pt's mom and apprised her of the situation.  Pt's mom stated that there is no family hx, on either side, of mental illness.  Pt's mom stated that Pt has never been a behavior px and that she is baffled by his current behaviors.  Pt's mom stated that Pt has been using marijuana, synthetic marijuana and xanax.    Pt's mom stated that she and Pt's girlfriend took Pt to [facility] on 3/31 due to "not acting right."  Pt was d/c'd same day and was sent home.  Pt's mom stated that she received a call from Pt's girlfriend on 4/2 due to Pt acting strange and threatening himself and others.  Pt was admitted to [facility] under IVC on that day.  CSW thanked Darl Pikes, Pt's mom, for her time.  CSW to continue to follow.  Providence Crosby, LCSWA Clinical Social Work [phone]

## 2011-12-09 LAB — CK TOTAL AND CKMB (NOT AT ARMC)
CK, MB: 2.1 ng/mL (ref 0.3–4.0)
Relative Index: 0.4 (ref 0.0–2.5)
Total CK: 573 U/L — ABNORMAL HIGH (ref 7–232)

## 2011-12-09 LAB — CREATININE, SERUM
Creatinine, Ser: 0.94 mg/dL (ref 0.50–1.35)
GFR calc Af Amer: >90 mL/min (ref >90)
GFR calc non Af Amer: >90 mL/min (ref >90)

## 2011-12-09 MED ORDER — DIPHENHYDRAMINE HCL 25 MG PO CAPS
25.0000 mg | ORAL_CAPSULE | Freq: Once | ORAL | Status: AC
  Administered 2011-12-09: 25 mg via ORAL
  Filled 2011-12-09: qty 1

## 2011-12-09 MED ORDER — BENZTROPINE MESYLATE 2 MG PO TABS
2.0000 mg | ORAL_TABLET | Freq: Once | ORAL | Status: AC
  Administered 2011-12-10: 2 mg via ORAL
  Filled 2011-12-09: qty 1

## 2011-12-09 MED ORDER — DIPHENHYDRAMINE HCL 25 MG PO CAPS
25.0000 mg | ORAL_CAPSULE | Freq: Four times a day (QID) | ORAL | Status: DC | PRN
  Filled 2011-12-09: qty 2

## 2011-12-09 MED ORDER — DIPHENHYDRAMINE HCL 25 MG PO CAPS: 25.0000 mg | ORAL_CAPSULE | Freq: Four times a day (QID) | ORAL | Status: DC | PRN

## 2011-12-09 MED ORDER — DIPHENHYDRAMINE HCL 50 MG PO CAPS
50.0000 mg | ORAL_CAPSULE | ORAL | Status: DC | PRN
  Administered 2011-12-09: 50 mg via ORAL

## 2011-12-09 MED ORDER — DIPHENHYDRAMINE HCL 50 MG/ML IJ SOLN: 25.0000 mg | Freq: Four times a day (QID) | INTRAMUSCULAR | Status: DC | PRN

## 2011-12-09 NOTE — Consult Note (Addendum)
Reason for Consult:Altered Mental Status Referring Physician: Dr. Dorena Dew is an 21 y.o. male.  HPI: Pt with known history of drug and alcohol use has been disoriented, agitated and aggressive since admission.  He admitted to synthetic marijuana, LSD, alcohol and other drugs.  He has been alternately in restraints and calm enough to have them off.  If he has no restraints he is very oppositional.  He intermittently is coherent enough to express some subjective thoughts, then lapses into a non-communicative phase.  He has been given top priority due to acuity to [facility].  AXIS I Acute Psychosis, Cannabis induced, r/o Schizophrenia, Polysubstance abuse  AXIS II Deferred  AXIS III History reviewed. No pertinent past medical history.  History reviewed. No pertinent past surgical history. AXIS IV  Supportive family, unresolved psychosis AXIS V  GAF  35   History reviewed. No pertinent family history.  Social History:  reports that he has been smoking.  He does not have any smokeless tobacco history on file. He reports that he drinks alcohol. He reports that he uses illicit drugs (Marijuana).  Allergies: No Known Allergies  Medications: I have reviewed the patient's current medications.  Results for orders placed during the hospital encounter of 12/01/11 (from the past 48 hour(s))  CREATININE, SERUM     Status: Normal   Collection Time   12/09/11  5:14 AM      Component Value Range Comment   Creatinine, Ser 0.94  0.50 - 1.35 (mg/dL)    GFR calc non Af Amer >90  >90 (mL/min)    GFR calc Af Amer >90  >90 (mL/min)   CK TOTAL AND CKMB     Status: Abnormal   Collection Time   12/09/11 10:20 AM      Component Value Range Comment   Total CK 573 (*) 7 - 232 (U/L)    CK, MB 2.1  0.3 - 4.0 (ng/mL)    Relative Index 0.4  0.0 - 2.5      No results found.  Review of Systems  Unable to perform ROS: other   Blood pressure 133/72, pulse 86, temperature 98.2 F (36.8 C), temperature  source Oral, resp. rate 18, height 6' (1.829 m), weight 81.5 kg (179 lb 10.8 oz), SpO2 100.00%. Physical Exam  Assessment/Plan: Chart reviewed. Discussed with RN, sitter and Psych CSW  Pt is seen ~ 12;30 pm 12/09/11 Pt is sitting on edge of bed, wearing his glasses.  Sitter is standing in doorway because pt continues to try an leave room.  He is greeted and he gradually looks and stages back.  He does not respond. To any question.  He is asked to raise his had 3 times before he lifts his right had. He stares blankly and says nothing.  This is a concern.  His decompensation may be momentary or an additive effect of Dopamine blockade with FGA he receives, Haldol, given to keep him from harming self or others.  NB RN's note observing 1630 that pt's eyes were rolling back is a signal of Occulogyric Crisis.  Benadryl was given, an antihistaminic. On call # paged twice RECOMMENDATION 1. Consider giving an anticholinergic medication Cogentin, benztropine, 1 mg 2 X daily and 1 dose stat for occulogyric crises to block EPS, excessive motoric movement of eye muscles.  2. Will follow pt 3. IVC renewed today an transfer to [facility] is pending   Bryce Leon 12/09/2011, 8:34 PM

## 2011-12-09 NOTE — Progress Notes (Signed)
Spoke with Junious Dresser at [place].  Pt accepted and moved to the priority list, which means that he'll likely be admitted today or tomorrow.  Junious Dresser stating that, upon admission, State will need the d/c summary, Petition, 3 days of vitals and 3 days of MAR.  Notified MD.  CSW to continue to follow.  Providence Crosby, LCSWA Clinical Social Work [phone]

## 2011-12-09 NOTE — Progress Notes (Addendum)
At approximately 1830 the sitter informed me that the pt's eyes had started drifting toward the ceiling and then pt would look at his girlfriend again and then his eyes would drift back up again. I assessed the pt and his eyes were drifting toward the ceiling for about 5 seconds and then he would look back at his girlfriend. When he looks at the ceiling he acts as if he can't hear you or see you. I paged the doctor and he said to continue to monitor the pt and to give 25mg  benadryl PO x 1.

## 2011-12-09 NOTE — Progress Notes (Signed)
Subjective: Received haldol injection in the morning due to non-approved behavior; no SOB, no CP and no fever.   Objective: Vital signs in last 24 hours: Temp:  [97.8 F (36.6 C)-98.2 F (36.8 C)] 97.8 F (36.6 C) (04/10 2200) Pulse Rate:  [63-91] 63  (04/10 2200) Resp:  [16-18] 16  (04/10 2200) BP: (110-135)/(60-79) 110/60 mmHg (04/10 2200) SpO2:  [99 %-100 %] 100 % (04/10 2200) Weight change:  Last BM Date: 12/07/11  Intake/Output from previous day: 04/09 0701 - 04/10 0700 In: 240 [P.O.:240] Out: -     Physical Exam: General: Awake, Calmed; able to recognize family members and able answer questions appropriate; no acute distress. Slow to respond and spacing out at times. HEENT: No bruits, no goiter. Heart: Regular rate and rhythm, without murmurs, rubs, gallops. Lungs: Clear to auscultation bilaterally. Abdomen: Soft, nontender, nondistended, positive bowel sounds. Extremities: No clubbing, cyanosis or edema with positive pedal pulses. Neuro: Grossly intact, nonfocal.  Lab Results: Basic Metabolic Panel:  Basename 12/09/11 0514  NA --  K --  CL --  CO2 --  GLUCOSE --  BUN --  CREATININE 0.94  CALCIUM --  MG --  PHOS --   Cardiac Enzymes:  Basename 12/09/11 1020  CKTOTAL 573*  CKMB 2.1  CKMBINDEX --  TROPONINI --   Urine Drug Screen: Drugs of Abuse     Component Value Date/Time   LABOPIA NONE DETECTED 12/01/2011 0517   COCAINSCRNUR NONE DETECTED 12/01/2011 0517   LABBENZ POSITIVE* 12/01/2011 0517   AMPHETMU NONE DETECTED 12/01/2011 0517   THCU POSITIVE* 12/01/2011 0517   LABBARB NONE DETECTED 12/01/2011 0517     Misc. Labs:  Recent Results (from the past 240 hour(s))  CSF CULTURE     Status: Normal   Collection Time   12/02/11 11:35 PM      Component Value Range Status Comment   Specimen Description CSF   Final    Special Requests NONE   Final    Gram Stain     Final    Value: CYTOSPIN WBC NO ORGANISMS SEEN     Gram Stain Report Called to,Read Back By  and Verified With: Gram Stain Report Called to,Read Back By and Verified With: BOUFFARD D/ED 0142 12/03/11 BY Governor Specking S Performed by [facility]   Culture NO GROWTH 3 DAYS   Final    Report Status 12/06/2011 FINAL   Final   GRAM STAIN     Status: Normal   Collection Time   12/02/11 11:35 PM      Component Value Range Status Comment   Specimen Description CSF   Final    Special Requests NONE   Final    Gram Stain     Final    Value: NO ORGANISMS SEEN     WBC PRESENT     CYTOSPIN SMEAR     CALLED TO BOUFFARD,D/ED @0142  ON 12/03/11 BY KARCZEWSKI,S.   Report Status 12/03/2011 FINAL   Final     Studies/Results: No results found.  Medications: Scheduled Meds:   . benztropine  2 mg Oral Once  . diphenhydrAMINE  25 mg Oral Once  . enoxaparin  40 mg Subcutaneous Q24H  . OLANZapine  10 mg Intramuscular See admin instructions  . sodium chloride  3 mL Intravenous Q12H  . DISCONTD: diphenhydrAMINE  50 mg Intramuscular Once   Continuous Infusions:   . dextrose 5 % and 0.9% NaCl     PRN Meds:.acetaminophen, diphenhydrAMINE, diphenhydrAMINE, haloperidol lactate, LORazepam, ondansetron (  ZOFRAN) IV, ondansetron, DISCONTD: diphenhydrAMINE, DISCONTD: diphenhydrAMINE, DISCONTD: diphenhydrAMINE  Assessment/Plan: 1-Acute psychosis: ongoing and despite zyprexa and haldol; patient symptoms continues. At this point even with some EPS symptoms due to meds. Will follow Psych recommendations for benztropine and will also give benadryl. Plan is for [facility] transfer for further inpatient psychiatry treatment.  2-Altered mental status: EEG and MRI WNL. Will continue following psych recommendations; will try to minimize benzos and haldol; patient is at this point somnolent.  3-Substance abuse: counseling provided; will continue receiving detox tx as an inpatient at [facility].  4-Leukocytosis and elevated CK: associated with demargination and stress due to been tase prior to admission. Pretty much esolved  with IVF's.  5-HTN (hypertension): well controlled w/o meds.  Dispo: plan is for [facility] placement once bed available and patient condition stable for transferring. Family updated.   LOS: 8 days   Abby Stines [facility] [phone]  12/09/2011, 10:39 PM

## 2011-12-09 NOTE — Progress Notes (Signed)
Updated Pt on d/c status.    Pt calm and quiet.  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  He stated that his family feels that he's "crazy" because of this invention and because he questions the government and money.  Pt stated that, although he didn't invent he chair upon which CSW was sitting, he came up with the idea.  Towards the end of the conversation, Pt quit answering questions and just stared at CSW.  CSW thanked Pt for his time.  Faxed repeat CK to State.  Received fax confirmation.  CSW to continue to follow.  Providence Crosby, LCSWA Clinical Social Work [phone]

## 2011-12-09 NOTE — Progress Notes (Addendum)
Pt denied by [facility] Coder due to behavioral acuity.  State requesting repeat CK.  Notified MD.  CSW to continue to follow.  Providence Crosby, LCSWA Clinical Social Work [phone]

## 2011-12-10 MED ORDER — HALOPERIDOL LACTATE 5 MG/ML IJ SOLN
5.0000 mg | Freq: Two times a day (BID) | INTRAMUSCULAR | Status: DC | PRN
  Administered 2011-12-10: 5 mg via INTRAMUSCULAR
  Filled 2011-12-10: qty 1

## 2011-12-10 MED ORDER — BENZTROPINE MESYLATE 1 MG PO TABS
1.0000 mg | ORAL_TABLET | Freq: Two times a day (BID) | ORAL | Status: DC
  Administered 2011-12-10 – 2011-12-11 (×3): 1 mg via ORAL
  Filled 2011-12-10 (×6): qty 1

## 2011-12-10 NOTE — Progress Notes (Signed)
Per Junious Dresser at [place], Pt still on priority list.  Junious Dresser states that the list is moving "slowly."  CSW to continue to follow.  Providence Crosby, LCSWA Clinical Social Work [phone]

## 2011-12-10 NOTE — Progress Notes (Signed)
Subjective: Alert, awake and able to follow commands. Patient cooperative and with good interaction during conversation/PE today. No SOB, no CP and no fever. His EPS symptoms significantly improved after benadryl and cogentin given yesterday.   Objective: Vital signs in last 24 hours: Temp:  [97.2 F (36.2 C)-98.2 F (36.8 C)] 97.2 F (36.2 C) (04/11 0556) Pulse Rate:  [63-86] 73  (04/11 0556) Resp:  [16-18] 16  (04/11 0556) BP: (110-133)/(60-74) 119/74 mmHg (04/11 0556) SpO2:  [100 %] 100 % (04/11 0556) Weight change:  Last BM Date: 12/07/11  Intake/Output from previous day: 04/10 0701 - 04/11 0700 In: 720 [P.O.:720] Out: -     Physical Exam: General: Awake, Calmed; able to recognize family members and able answer questions appropriate; no acute distress. Slow to respond and spacing out at times. HEENT: No bruits, no goiter. Heart: Regular rate and rhythm, without murmurs, rubs, gallops. Lungs: Clear to auscultation bilaterally. Abdomen: Soft, nontender, nondistended, positive bowel sounds. Extremities: No clubbing, cyanosis or edema with positive pedal pulses. Neuro: Grossly intact, nonfocal.  Lab Results: Basic Metabolic Panel:  Basename 12/09/11 0514  NA --  K --  CL --  CO2 --  GLUCOSE --  BUN --  CREATININE 0.94  CALCIUM --  MG --  PHOS --   Cardiac Enzymes:  Basename 12/09/11 1020  CKTOTAL 573*  CKMB 2.1  CKMBINDEX --  TROPONINI --   Urine Drug Screen: Drugs of Abuse     Component Value Date/Time   LABOPIA NONE DETECTED 12/01/2011 0517   COCAINSCRNUR NONE DETECTED 12/01/2011 0517   LABBENZ POSITIVE* 12/01/2011 0517   AMPHETMU NONE DETECTED 12/01/2011 0517   THCU POSITIVE* 12/01/2011 0517   LABBARB NONE DETECTED 12/01/2011 0517     Misc. Labs:  Recent Results (from the past 240 hour(s))  CSF CULTURE     Status: Normal   Collection Time   12/02/11 11:35 PM      Component Value Range Status Comment   Specimen Description CSF   Final    Special Requests  NONE   Final    Gram Stain     Final    Value: CYTOSPIN WBC NO ORGANISMS SEEN     Gram Stain Report Called to,Read Back By and Verified With: Gram Stain Report Called to,Read Back By and Verified With: BOUFFARD D/ED 0142 12/03/11 BY Governor Specking S Performed by [facility]   Culture NO GROWTH 3 DAYS   Final    Report Status 12/06/2011 FINAL   Final   GRAM STAIN     Status: Normal   Collection Time   12/02/11 11:35 PM      Component Value Range Status Comment   Specimen Description CSF   Final    Special Requests NONE   Final    Gram Stain     Final    Value: NO ORGANISMS SEEN     WBC PRESENT     CYTOSPIN SMEAR     CALLED TO BOUFFARD,D/ED @0142  ON 12/03/11 BY KARCZEWSKI,S.   Report Status 12/03/2011 FINAL   Final     Studies/Results: No results found.  Medications: Scheduled Meds:    . benztropine  1 mg Oral BID  . benztropine  2 mg Oral Once  . diphenhydrAMINE  25 mg Oral Once  . enoxaparin  40 mg Subcutaneous Q24H  . OLANZapine  10 mg Intramuscular See admin instructions  . sodium chloride  3 mL Intravenous Q12H  . DISCONTD: diphenhydrAMINE  50 mg Intramuscular Once  Continuous Infusions:    . dextrose 5 % and 0.9% NaCl     PRN Meds:.acetaminophen, diphenhydrAMINE, diphenhydrAMINE, haloperidol lactate, LORazepam, ondansetron (ZOFRAN) IV, ondansetron, DISCONTD: diphenhydrAMINE, DISCONTD: diphenhydrAMINE, DISCONTD: diphenhydrAMINE, DISCONTD: haloperidol lactate  Assessment/Plan: 1-Acute psychosis: Finally showing signs of improvement. Patient spacing out less and with meds given EPS significantly better, no further agitation appreciated. Will follow Psych recommendations for further management and will follow status for [facility] transfer for further inpatient psychiatry treatment once bed available.  2-Altered mental status: EEG and MRI WNL. Will continue following psych recommendations; will continue trying to minimize benzos and haldol; patient is less somnolent  today.  3-Substance abuse: counseling provided; will continue receiving detox tx as an inpatient at [facility].  4-Leukocytosis and elevated CK: associated with demargination and stress due to been tase prior to admission. Pretty much resolved with IVF's. Patient advise to continue drinking fluids and keeping himself hydrated.  5-HTN (hypertension): well controlled w/o meds. Most likely up due to withdrawal symptoms.  Dispo: plan is for [facility] placement once bed available. He is medically stable from IM standpoint for for transferring.    LOS: 9 days   Lemoyne Nestor [facility] [phone]  12/10/2011, 11:55 AM

## 2011-12-10 NOTE — Plan of Care (Signed)
Problem: Phase III Progression Outcomes Goal: Activity at appropriate level-compared to baseline (UP IN CHAIR FOR HEMODIALYSIS)  Outcome: Completed/Met Date Met:  12/10/11 Out ambulating in hallway with sitter

## 2011-12-10 NOTE — Progress Notes (Signed)
Notified MD that the priority list at [facility] is moving slowly.  Spoke with Pt at the nurses's station.  Pt's affect was bright and cheerful.  Pt smiled broadly at CSW and stated that he feels "really good."  Pt still stared at CSW for a period of time before answering questions and continued to have trouble expressing himself.  Pt wasn't able to answer whether he had family visit with him today.  CSW to continue to follow.  Providence Crosby, LCSWA Clinical Social Work [phone]

## 2011-12-10 NOTE — Consult Note (Signed)
Reason for Consult:Altered Mental Status   Acute Psychosis Referring Physician: Dr. Dorena Dew is an 21 y.o. male.  HPI: Pt with known history of drug and alcohol use has been disoriented, agitated and aggressive since admission. He admitted to synthetic marijuana, LSD, alcohol and other drugs. He has been alternately in restraints and calm enough to have them off. If he has no restraints he is very oppositional. He intermittently is coherent enough to express some subjective thoughts, then lapses into a non-communicative phase. He has been given top priority due to acuity to [place].   AXIS I Acute Psychosis, Cannabis induced, r/o Schizophrenia, Polysubstance abuse  AXIS II Deferred  AXIS III History reviewed. No pertinent past medical history.  History reviewed. No pertinent past surgical history. AXIS IV  Family support, unresolved psychosis AXIS V  GAF  46    History reviewed. No pertinent family history.  Social History:  reports that he has been smoking.  He does not have any smokeless tobacco history on file. He reports that he drinks alcohol. He reports that he uses illicit drugs (Marijuana).  Allergies: No Known Allergies  Medications: I have reviewed the patient's current medications.  Results for orders placed during the hospital encounter of 12/01/11 (from the past 48 hour(s))  CREATININE, SERUM     Status: Normal   Collection Time   12/09/11  5:14 AM      Component Value Range Comment   Creatinine, Ser 0.94  0.50 - 1.35 (mg/dL)    GFR calc non Af Amer >90  >90 (mL/min)    GFR calc Af Amer >90  >90 (mL/min)   CK TOTAL AND CKMB     Status: Abnormal   Collection Time   12/09/11 10:20 AM      Component Value Range Comment   Total CK 573 (*) 7 - 232 (U/L)    CK, MB 2.1  0.3 - 4.0 (ng/mL)    Relative Index 0.4  0.0 - 2.5      No results found.  Review of Systems  Unable to perform ROS: other   Blood pressure 119/74, pulse 73, temperature 97.2 F (36.2 C),  temperature source Oral, resp. rate 16, height 6' (1.829 m), weight 81.5 kg (179 lb 10.8 oz), SpO2 100.00%. Physical Exam  Assessment/Plan: Chart is reviewed.  Discussed with Dr. Gwenlyn Perking and Psych CSW  Pt is interviewed with pt  Sitter.  He is sitting on bedside.  He wears glasses.  He has good eye contact without the 'vacant stare'.  He smiles and engages in conversation with logical coherent sentences.  He says he feels better.  He has seen his GF last night and had slept well. He says he had been with his friend and showed him his invention.  When asked if he had registered it for patent, he fades away looking away and pauses.  He slowly returns, answers no in a very weak voice.  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  Several times during a very coherent conversation, he stops, fades away and then comes back to the discussion.  He is unable to explain a proverb, neither concrete or abstract thought; is very quick and accurate with serial 7s.  He is oriented to person place - situation is in question.  He admits he smokes cannabis with GF but does not elaborate on any other drug use.  This is the first coherent conversation, even with  his dissociated stares, with this patient.  Prior attempts with pt, he was either non-verbal banging arms on bedside verbally aggressive and paranoid without any willingness to engage in conversation.  This is early indication that there is some cognitive clearing taking place.  NB  Later in the am  Pt is seen walking down the hall with a newspaper held up to cover his face [and vision].  RECOMMENDATION 1. Continue IVC and plan to transfer to [facility] when bed becomes available. 2. Continue SGA Zyprexa, olanzapine, 10 mg daily with  Cogentin benztropine 1 mg 2 X daily 3. Pt does not have capacity at this time and is a danger to self; observation  Continues for any further aggressive outbursts [emotional dyscontrol] 4. Will follow pt.   Gerre Ranum,  Paulla Mcclaskey 12/10/2011, 4:31 PM

## 2011-12-11 MED ORDER — OLANZAPINE 10 MG PO TABS: 10.0000 mg | ORAL_TABLET | Freq: Every day | ORAL | Status: DC

## 2011-12-11 MED ORDER — BENZTROPINE MESYLATE 1 MG PO TABS: 1.0000 mg | ORAL_TABLET | Freq: Two times a day (BID) | ORAL | Status: DC
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
## 2011-12-11 NOTE — Progress Notes (Signed)
Patient ok to discharge to [facility] today. Discharge summary & clinical information faxed over and accepted. Sheriff called for transport.   Unice Bailey, LCSWA [phone]

## 2011-12-11 NOTE — Progress Notes (Addendum)
Notified by Junious Dresser at [facility] that Pt has a bed available.  State requesting 3 days of vitals and MAR, along with the d/c summary.  Notified MD.  Awaiting d/c summary.  Notified Pt.  Pt gave CSW permission to contact his mom to apprise her of the situation.  Notified Pt's mom of the situation and answered questions.  CSW to continue to follow.  Providence Crosby, LCSWA Clinical Social Work [phone]

## 2011-12-13 NOTE — Progress Notes (Signed)
Pt was seen and examined at bedside. I agree with the assessment and plan outlined above.  Rakin Lemelle [phone] 

## 2013-01-29 IMAGING — CR DG CHEST 2V
2 series · 2 of 2 positions shown · non-contrast
Comparison: None

CLINICAL DATA: Altered mental status.

CHEST - 2 VIEW

[w chest lat]
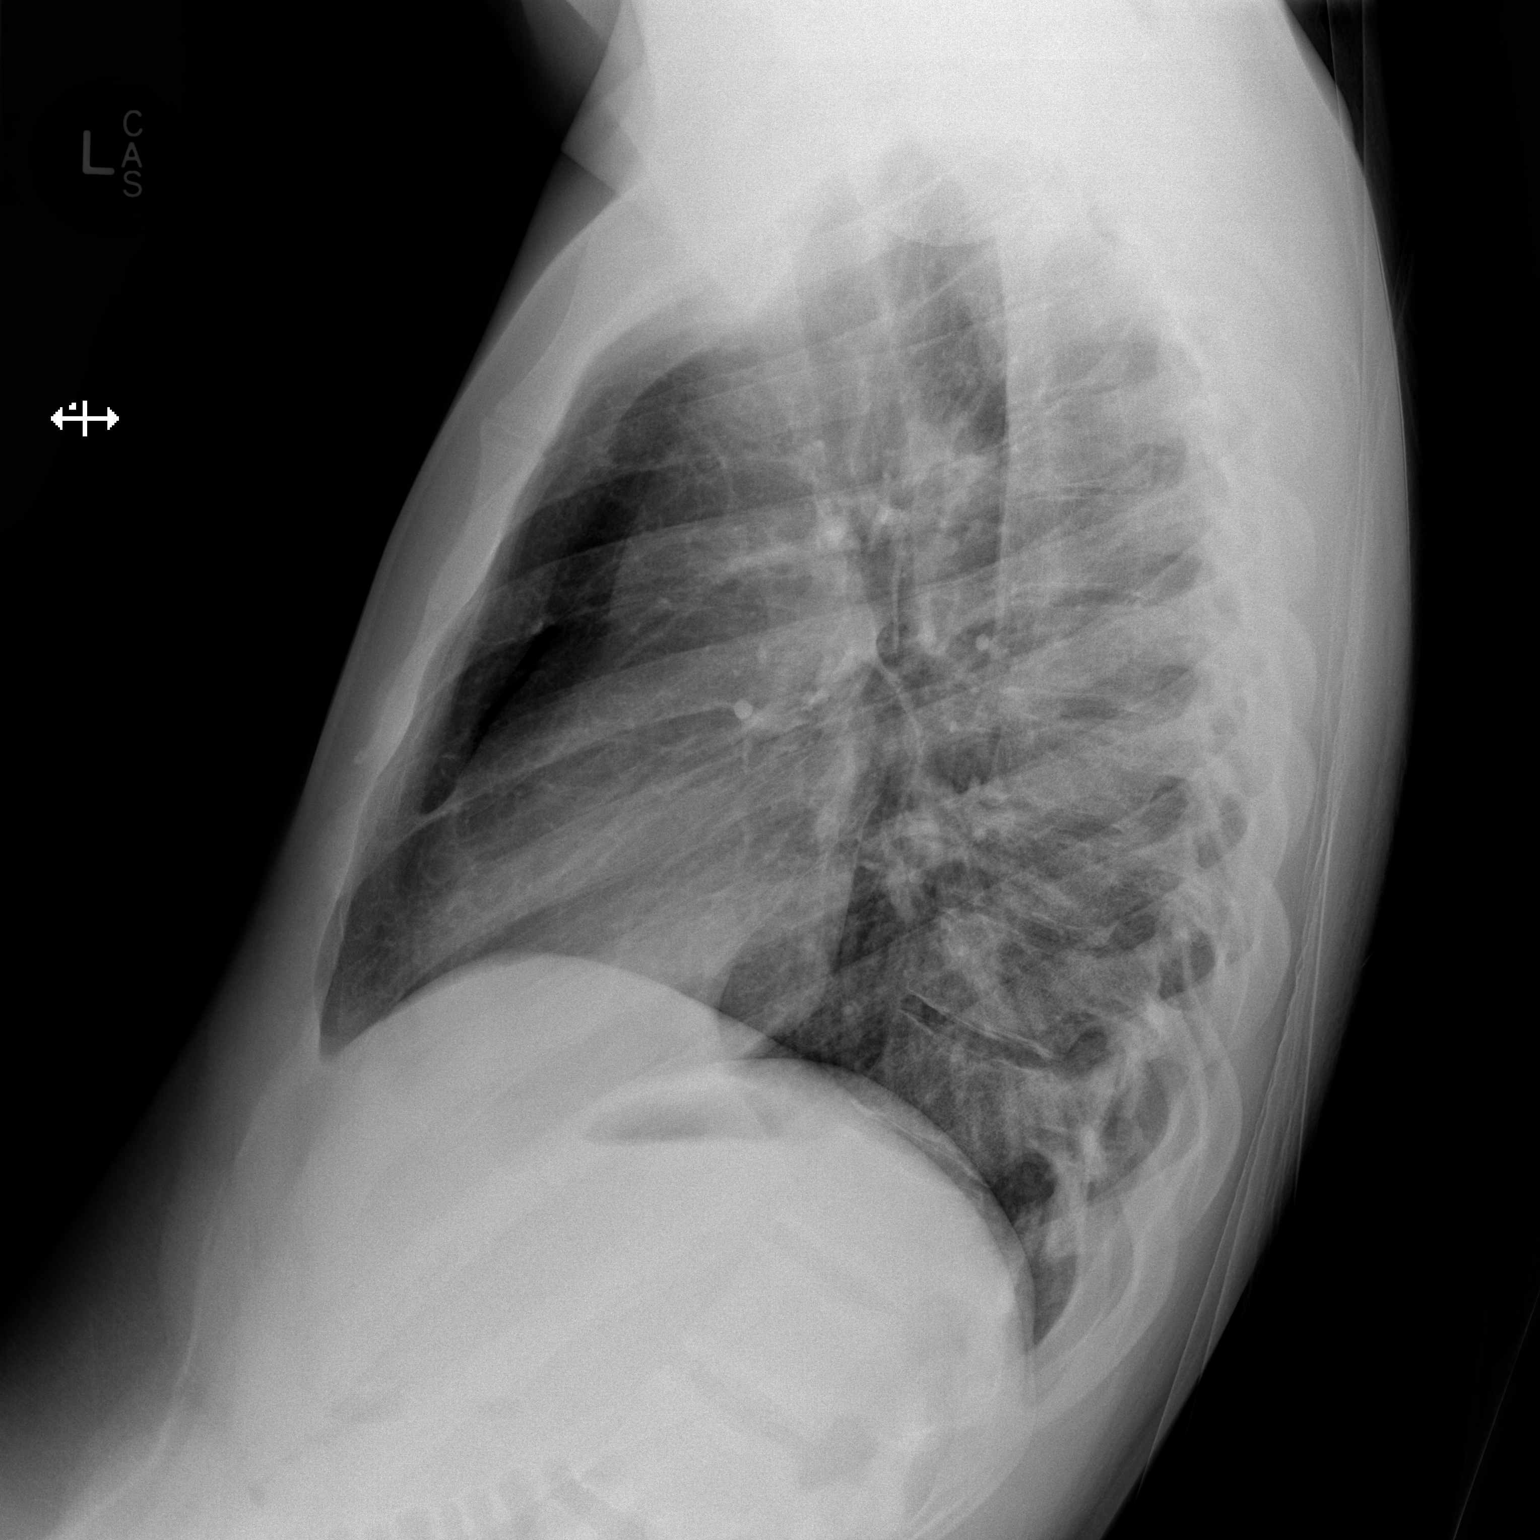

[x chest ap]
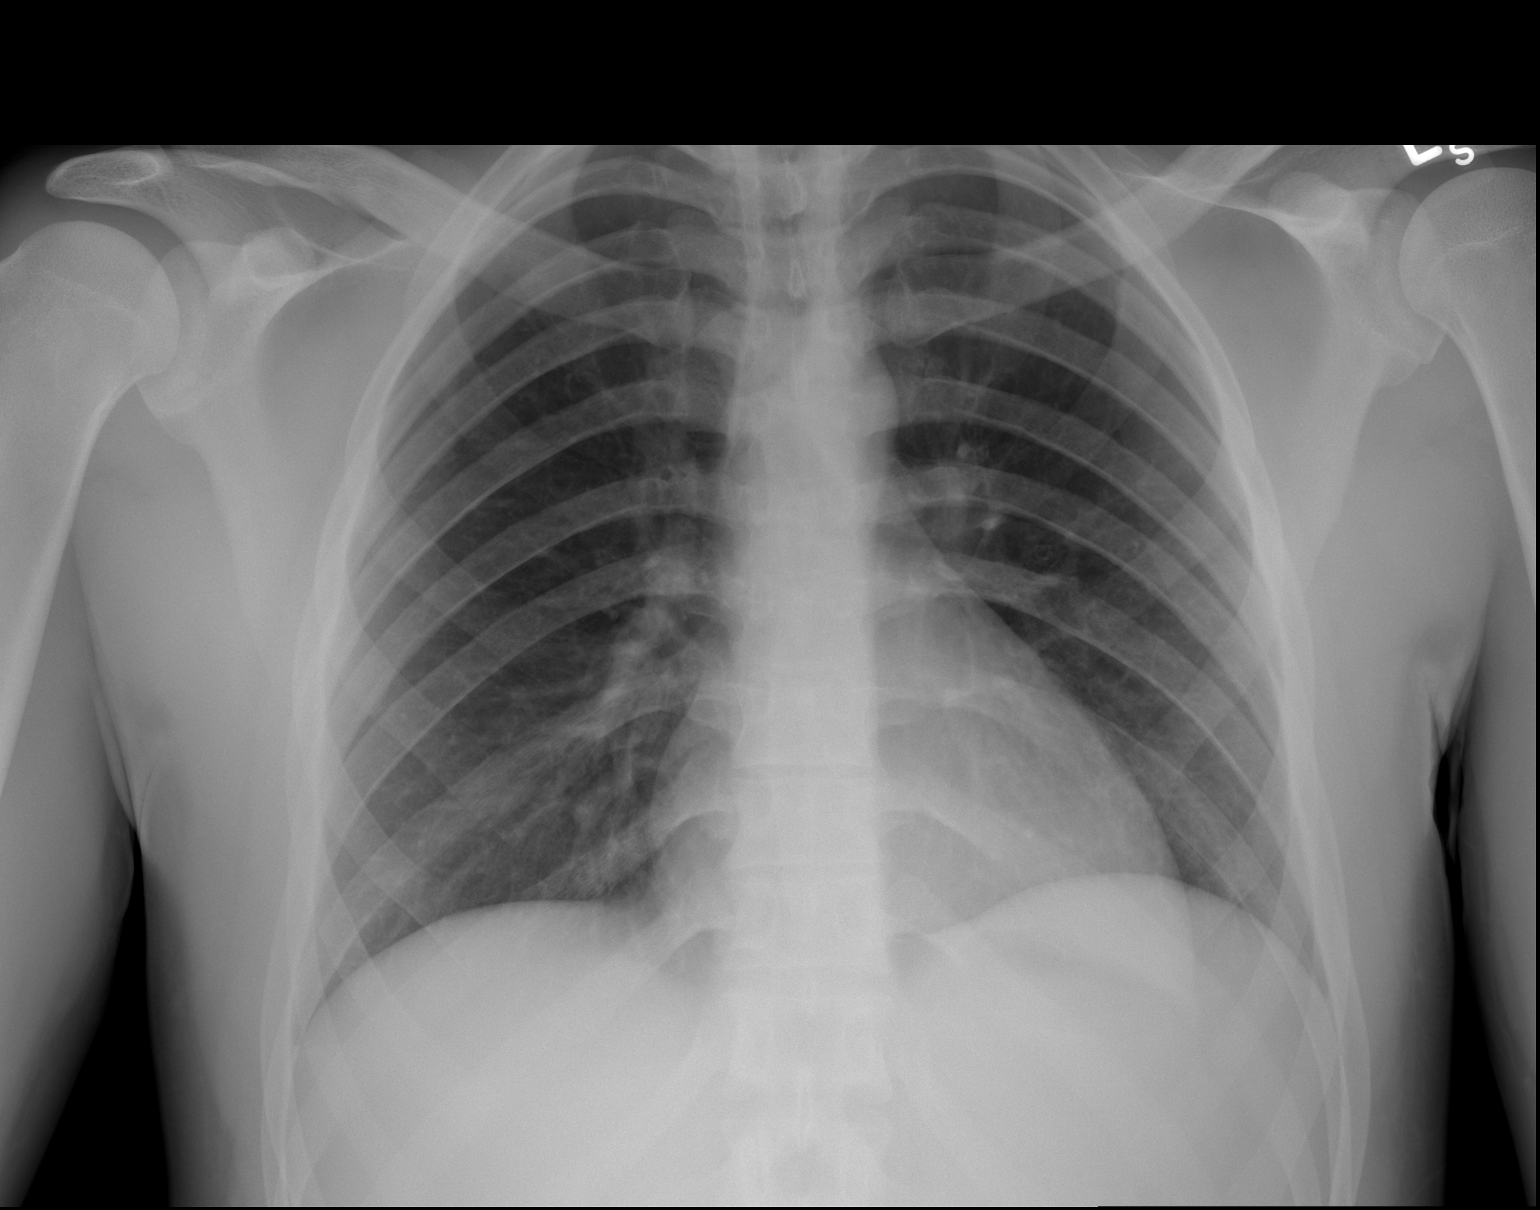

[2 of 2 positions shown; findings below may reference images not displayed]

FINDINGS: The cardiac silhouette, mediastinal and hilar contours
are within normal limits.  The lungs are clear.  No pleural
effusion.  The bony thorax is intact.
IMPRESSION: No acute cardiopulmonary findings.

## 2013-01-29 IMAGING — CT CT HEAD W/O CM
2 series · 16 of 30 positions shown, 20 images · non-contrast
Comparison: None

CLINICAL DATA: Mental status changes.

CT HEAD WITHOUT CONTRAST
TECHNIQUE: Contiguous axial images were obtained from the base of
the skull through the vertex without contrast.

[Series 2: head w/o · axial · non-contrast · 0.47mm/px · z∈[-563,-443]mm · 13 of 30 slices shown, 17 images]
[im 3/30  brain]
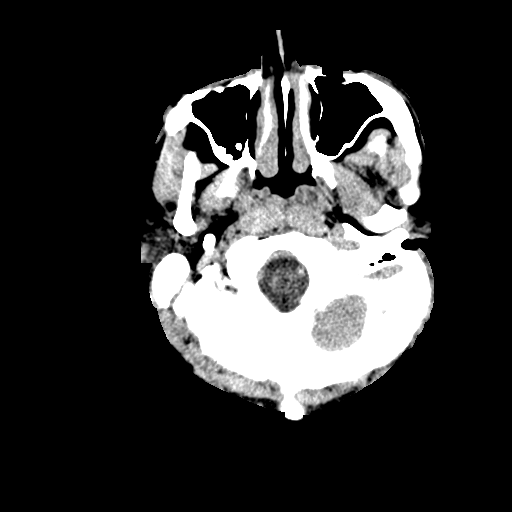
[im 3/30  bone]
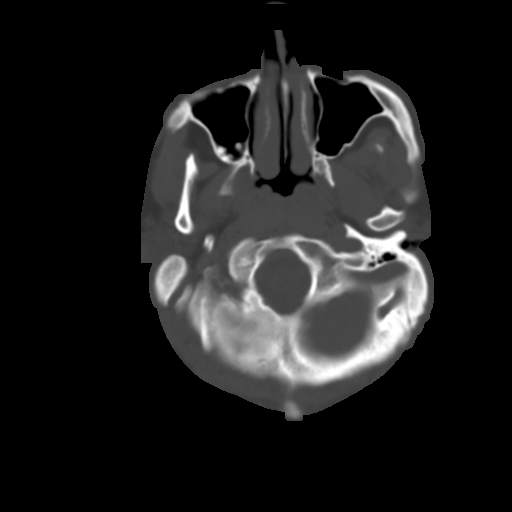
[im 5/30  brain]
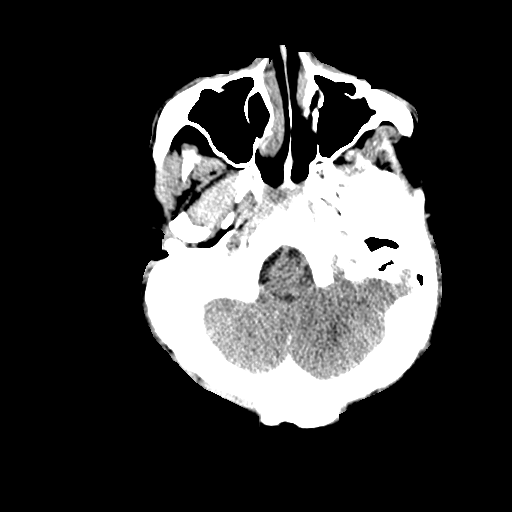
[im 7/30  brain]
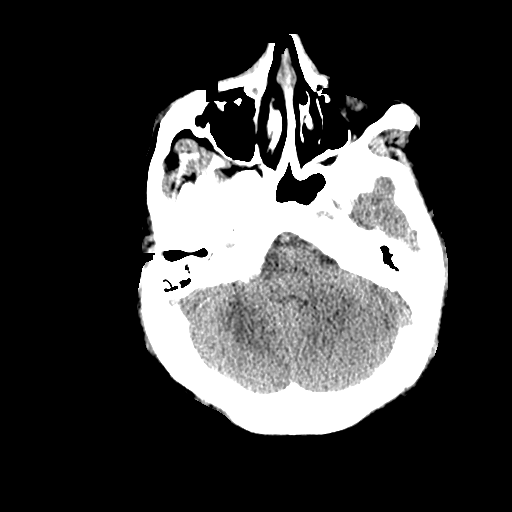
[im 9/30  brain]
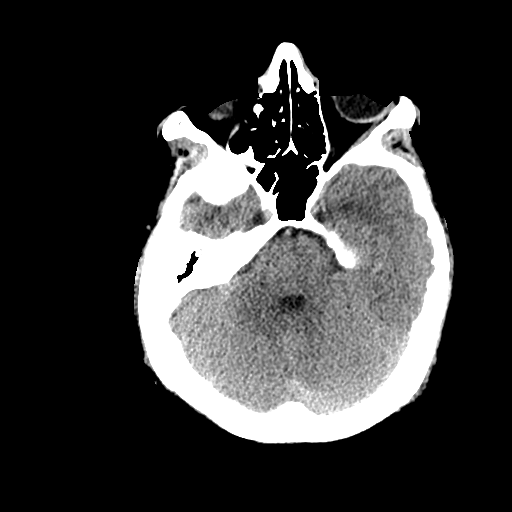
[im 11/30  brain]
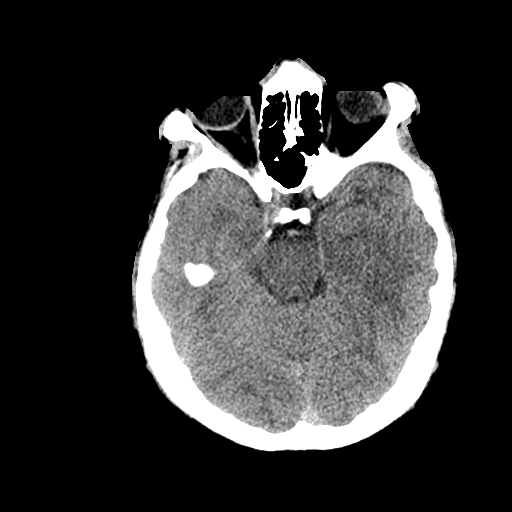
[im 11/30  bone]
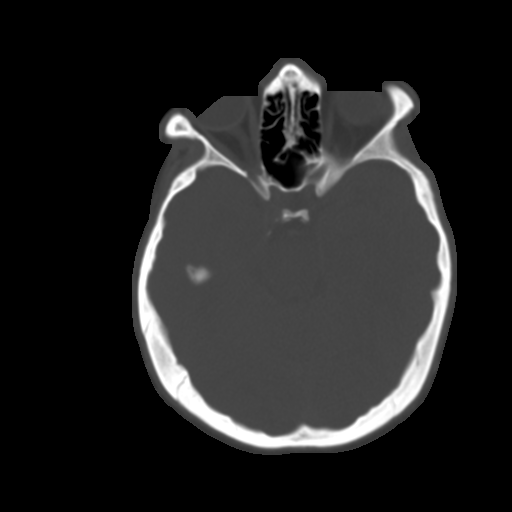
[im 13/30  brain]
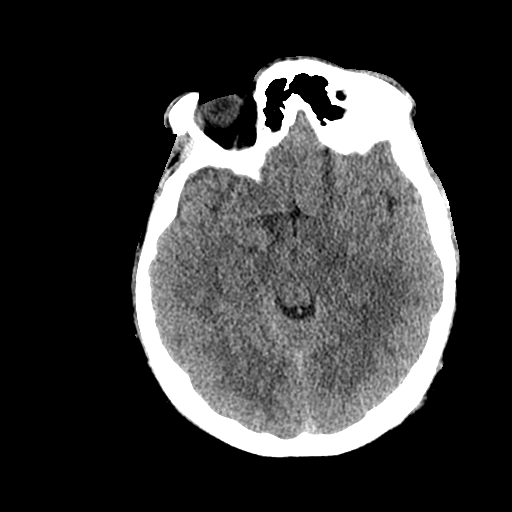
[im 15/30  brain]
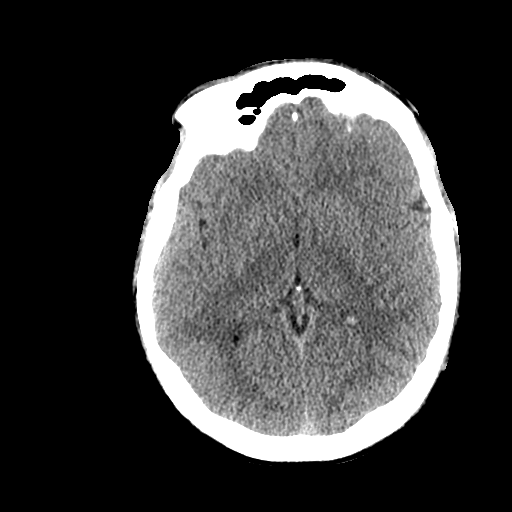
[im 17/30  brain]
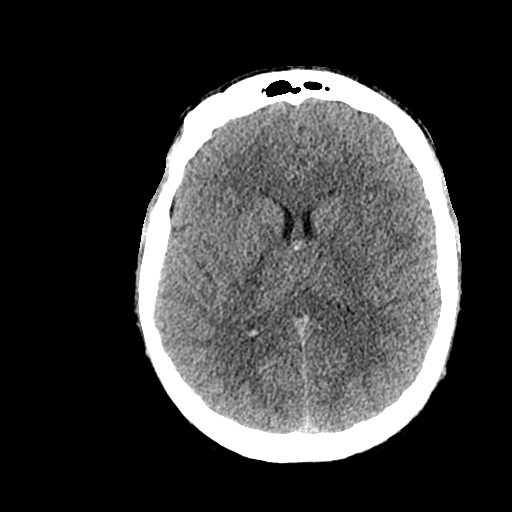
[im 19/30  brain]
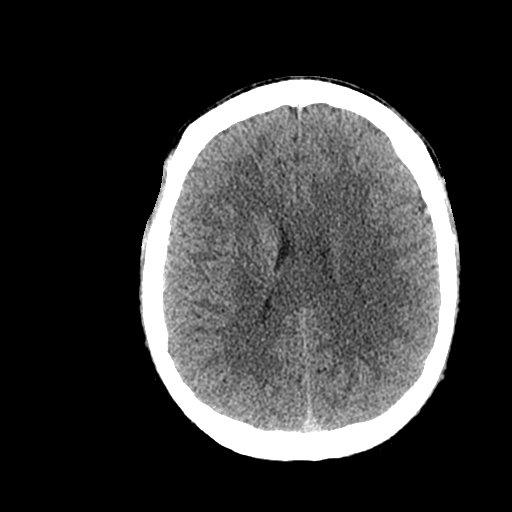
[im 19/30  bone]
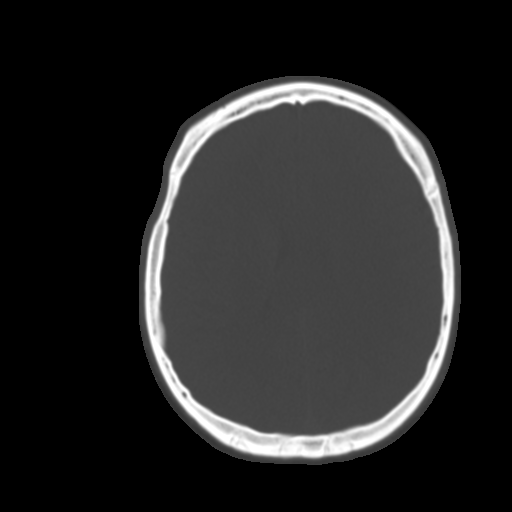
[im 21/30  brain]
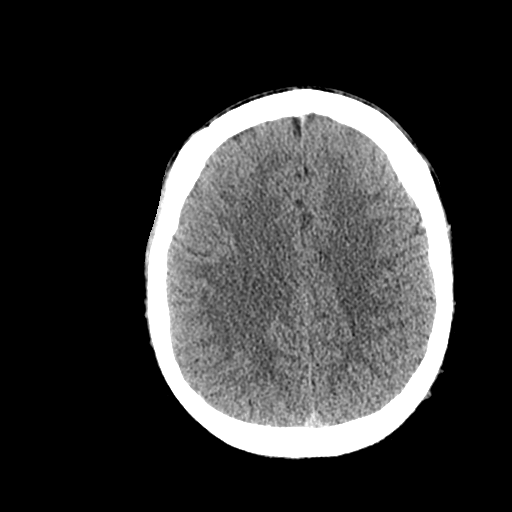
[im 23/30  brain]
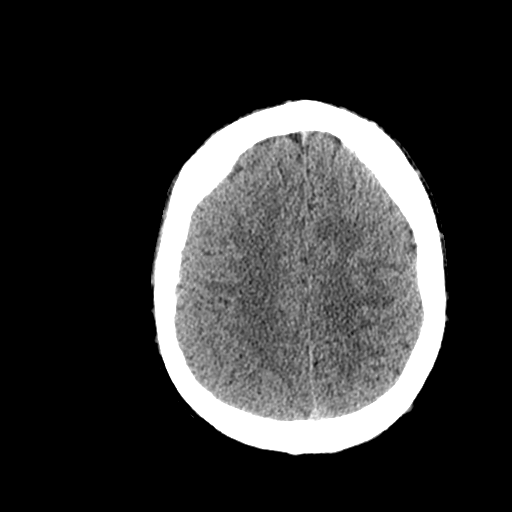
[im 25/30  brain]
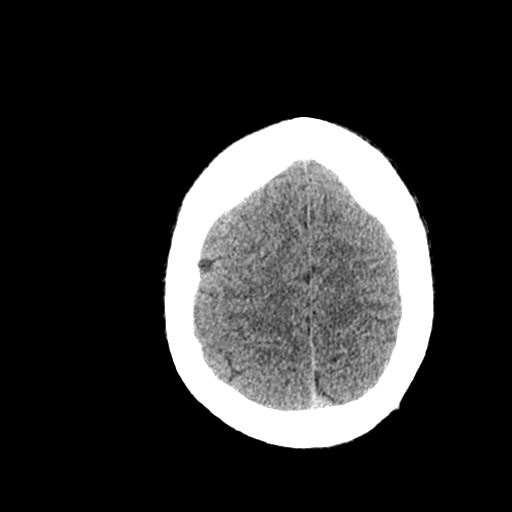
[im 27/30  brain]
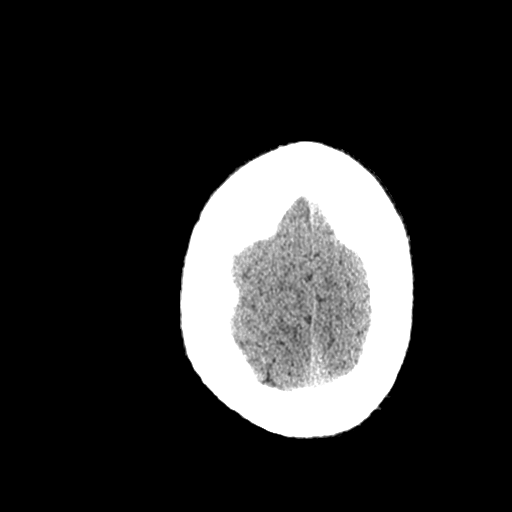
[im 27/30  bone]
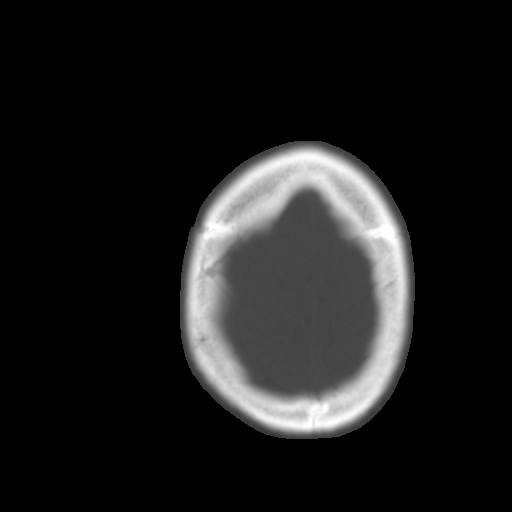

[Series 3: bone windows · axial · 0.47mm/px · z∈[-563,-523]mm · 3 of 30 slices shown]
[im 3/30  bone]
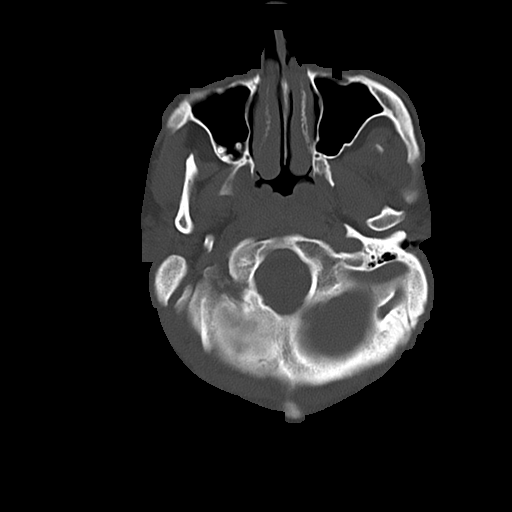
[im 7/30  bone]
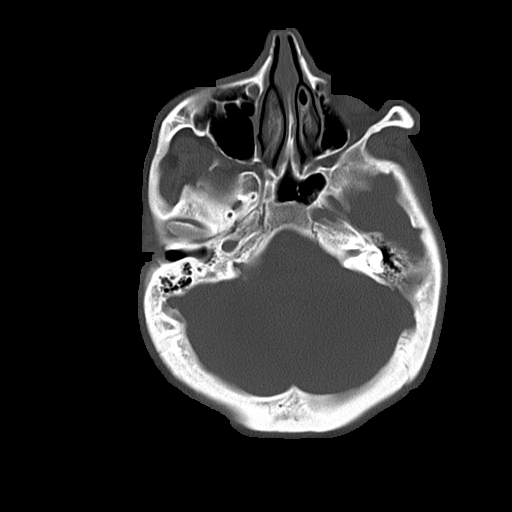
[im 11/30  bone]
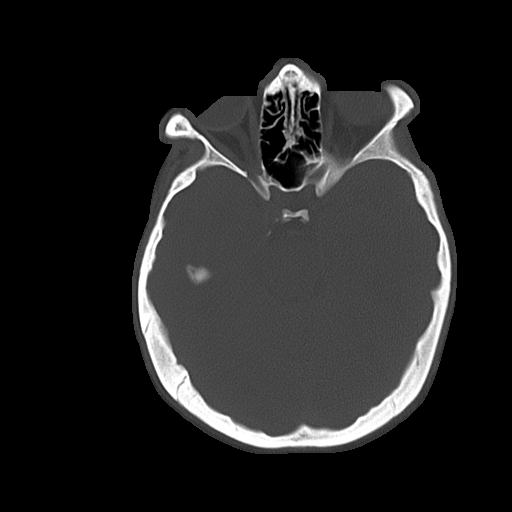

[16 of 30 positions shown; findings below may reference images not displayed]

FINDINGS: The ventricles are normal.  No extra-axial fluid
collections are seen.  The brainstem and cerebellum are
unremarkable.  No acute intracranial findings such as infarction or
hemorrhage.  No mass lesions. There is a very subtle hyperdensity
in the region of the foramen of Tiger.  This could be a small
colloid cyst.  No hydrocephalus.  I would recommend follow-up
noncontrast CT scan in 6 months to reevaluate.

The bony calvarium is intact.  The visualized paranasal sinuses and
mastoid air cells are clear.
IMPRESSION: 1.  No acute intracranial findings or mass lesion.
2.  Possible small colloid cyst near the foramen of Tiger.
Recommend follow-up CT scan in 6 months.

## 2014-02-05 ENCOUNTER — Ambulatory Visit (INDEPENDENT_AMBULATORY_CARE_PROVIDER_SITE_OTHER): Payer: No Typology Code available for payment source | Admitting: General Surgery

## 2014-02-05 ENCOUNTER — Telehealth (INDEPENDENT_AMBULATORY_CARE_PROVIDER_SITE_OTHER): Payer: Self-pay | Admitting: General Surgery

## 2014-02-05 ENCOUNTER — Encounter (INDEPENDENT_AMBULATORY_CARE_PROVIDER_SITE_OTHER): Payer: Self-pay | Admitting: General Surgery

## 2014-02-05 VITALS — BP 134/82 | HR 86 | Temp 98.4°F | Ht 72.0 in | Wt 245.0 lb

## 2014-02-05 DIAGNOSIS — L0591 Pilonidal cyst without abscess: Secondary | ICD-10-CM

## 2014-02-05 NOTE — Progress Notes (Signed)
Subjective:     Patient ID: Bryce Leon, male   DOB: 11/27/1990, 23 y.o.   MRN: [id number]  HPI The patient comes in with an infected pilonidal cyst that is partially drained on its own.  Review of Systems No fevers or chills.    Objective:   Physical Exam 3-4 cm partially open draining pilonidal cyst at the upper aspect of the gluteal fold. No cyanosis are seen near the anus.    Assessment:     Partially draining pilonidal cyst with infection.     Plan:     The patient has been given antibiotics and he would take pills. It appears as though he was given Augmentin.  Under local anesthesia with a Betadine prep I incised this pilonidal cyst with a #11 blade and then packed it with about 8 inches of quarter inch iodoform Nu Gauze. He will return in 2 days for packing removal and reassessment. Eventually he would need the pilonidal disease area removed.

## 2014-02-05 NOTE — Telephone Encounter (Signed)
Pt called in asking for pain medication this evening after undergoing I&D of pilonidal cyst in office by Dr Lindie Spruce earlier today. States he was told he could call in and ask for pain meds later today if he needed them. Explained unable to call in narcotics. Pt advised to do sitz bathes and alternate tylenol and motrin. And to call office in am if felt he still needed pain meds.

## 2014-02-07 ENCOUNTER — Encounter (INDEPENDENT_AMBULATORY_CARE_PROVIDER_SITE_OTHER): Payer: Self-pay | Admitting: *Deleted

## 2014-02-07 ENCOUNTER — Ambulatory Visit (INDEPENDENT_AMBULATORY_CARE_PROVIDER_SITE_OTHER): Payer: No Typology Code available for payment source | Admitting: *Deleted

## 2014-02-07 DIAGNOSIS — L0591 Pilonidal cyst without abscess: Secondary | ICD-10-CM

## 2014-02-07 NOTE — Progress Notes (Signed)
Pt came in today for a nurse only visit for packing removal and assessment of pilonidal cyst.  Pt saw Dr. Lindie Spruce 02/05/14.  Pt removed the packing hisself 02/06/14.  The wound still had very little drainage.  The wound looks good, no redness, no swelling, and no infection.  Pt verbalized that he feels a lot better since Dr. Lindie Spruce opened it.  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  I advised pt that that was depending on how he felt, but I did not see anywhere in Dr. Dixon Boos notes that he had any restrictions on him.  Pt verbalized understanding.  Pt was not scheduled to return to see Dr. Lindie Spruce, but was stated that the pt would eventually have to have the pilonidal cyst surgically removed at some point and time.    I advised pt that if he notices any worsening symptoms, redness, smell, discolored drainage, to please call the office.  Pt verbalized understanding.  Otherwise, we will see pt back as needed.  Victorino Dike

## 2016-05-15 ENCOUNTER — Ambulatory Visit: Payer: Self-pay | Admitting: Licensed Clinical Social Worker

## 2016-05-18 ENCOUNTER — Ambulatory Visit: Payer: Self-pay | Admitting: Psychiatry

## 2016-05-26 ENCOUNTER — Ambulatory Visit: Payer: Self-pay | Admitting: Psychiatry

## 2016-05-27 ENCOUNTER — Encounter: Payer: Self-pay | Admitting: Psychiatry

## 2016-05-27 ENCOUNTER — Ambulatory Visit (INDEPENDENT_AMBULATORY_CARE_PROVIDER_SITE_OTHER): Payer: 59 | Admitting: Psychiatry

## 2016-05-27 VITALS — BP 130/84 | HR 102 | Temp 98.4°F | Ht 72.0 in | Wt 206.6 lb

## 2016-05-27 DIAGNOSIS — F3110 Bipolar disorder, current episode manic without psychotic features, unspecified: Secondary | ICD-10-CM

## 2016-05-27 MED ORDER — DIVALPROEX SODIUM 500 MG PO DR TAB: DELAYED_RELEASE_TABLET | ORAL | 1 refills | Status: DC

## 2016-05-27 MED ORDER — HYDROXYZINE HCL 50 MG PO TABS: 50.0000 mg | ORAL_TABLET | Freq: Three times a day (TID) | ORAL | Status: DC | PRN

## 2016-05-27 MED ORDER — DIVALPROEX SODIUM 250 MG PO DR TAB: 750.0000 mg | DELAYED_RELEASE_TABLET | Freq: Every day | ORAL | 1 refills | Status: DC

## 2016-05-27 MED ORDER — ZIPRASIDONE HCL 20 MG PO CAPS: 20.0000 mg | ORAL_CAPSULE | Freq: Two times a day (BID) | ORAL | Status: DC

## 2016-05-27 NOTE — Progress Notes (Signed)
Psychiatric Initial Adult Assessment   Patient Identification: Bryce Leon MRN:  [id number] Date of Evaluation:  05/27/2016 Referral Source: [facility] Chief Complaint:   Chief Complaint    Establish Care     Visit Diagnosis:    ICD-9-CM ICD-10-CM   1. Bipolar I disorder, most recent episode (or current) manic ([place]) 296.40 F31.10     History of Present Illness:  Patient is a 25 year old Caucasian male seen for an evaluation of his bipolar disorder. Patient was most recently admitted at [facility] and states that he had 2 hospitalizations in the past month. States that the first time he did not take his medications and had another manic episode and was admitted. He was prescribed Depakote to take 1500 mg and states he's been taking that regularly. However the Geodon that he is supposed to take and down twice daily patient reports he stopped taking for the past 2 days. States that he is not sure if it is helping him. States that he lives alone by himself and his parents live in the area and they're supportive towards him. States that one month ago he was feeling very depressed and hopeless and does endorse a manic episode with the feeling on top of things and having racing thoughts. States he was using marijuana and that being made him paranoid and he was not doing well. Currently reports his mood has gotten better but he does not like to take medications. States he that he is paranoid in general and worries about what may be in medications and why he should be taking those. He reports that currently he is doing better and he would like to take medications to help him from getting manic and depressed. He denies any psychotic symptoms currently. He denies any manic symptoms currently. He denies any suicidal thoughts currently. States that he is living with a girlfriend who he met in the hospital and it's going well. He also reports that hydroxyzine has worked well for him and he  would like to continue the hydroxyzine on a when necessary basis for anxiety. States the Seroquel makes him very sleepy and he was told he can be taking Seroquel and Geodon together.  Associated Signs/Symptoms:    Depression Symptoms:  Somewhat depressed, but better than before (Hypo) Manic Symptoms:  denies Anxiety Symptoms:  Excessive Worry, Psychotic Symptoms:  denies PTSD Symptoms: Emotional abuse  Past Psychiatric History:   Previous Psychotropic Medications: Yes   Substance Abuse History in the last 12 months:  Yes.    Consequences of Substance Abuse: Medical Consequences:  psychosis   Past Medical History:  Past Medical History:  Diagnosis Date  . ADHD (attention deficit hyperactivity disorder)   . Anxiety   . Arthritis   . Bipolar disorder ([place])   . Depression    History reviewed. No pertinent surgical history.  Family Psychiatric History: denies  Family History: History reviewed. No pertinent family history.  Social History:   Social History   Social History  . Marital status: Single    Spouse name: N/A  . Number of children: N/A  . Years of education: N/A   Social History Main Topics  . Smoking status: Current Every Day Smoker    Types: Cigarettes    Start date: 05/27/2005  . Smokeless tobacco: Former Systems developer    Types: Snuff, Chew  . Alcohol use 16.8 oz/week    21 Cans of beer, 7 Shots of liquor per week  . Drug use:  Types: Marijuana     Comment: last used about 3 weeks ago  . Sexual activity: Not Currently   Other Topics Concern  . None   Social History Narrative  . None    Additional Social History: Living by himself with girlfriend  Allergies:  No Known Allergies  Metabolic Disorder Labs: No results found for: HGBA1C, MPG No results found for: PROLACTIN No results found for: CHOL, TRIG, HDL, CHOLHDL, VLDL, LDLCALC   Current Medications: Current Outpatient Prescriptions  Medication Sig Dispense Refill  . divalproex (DEPAKOTE)  250 MG DR tablet Take 3 tablets (750 mg total) by mouth daily. 90 tablet 1  . divalproex (DEPAKOTE) 500 MG DR tablet TAKE 1 TABELT BY MOUTH DAILY 30 tablet 1   Current Facility-Administered Medications  Medication Dose Route Frequency Provider Last Rate Last Dose  . hydrOXYzine (ATARAX/VISTARIL) tablet 50 mg  50 mg Oral TID PRN Olita Takeshita, MD      . ziprasidone (GEODON) capsule 20 mg  20 mg Oral BID WC Carmen Tolliver, MD        Neurologic: Headache: No Seizure: No Paresthesias:No  Musculoskeletal: Strength & Muscle Tone: within normal limits Gait & Station: normal Patient leans: N/A  Psychiatric Specialty Exam: ROS  Blood pressure 130/84, pulse (!) 102, temperature 98.4 F (36.9 C), temperature source Oral, height 6' (1.829 m), weight 206 lb 9.6 oz (93.7 kg).Body mass index is 28.02 kg/m.  General Appearance: Casual  Eye Contact:  Fair  Speech:  Clear and Coherent  Volume:  Normal  Mood:  Anxious and Euphoric  Affect:  Blunt  Thought Process:  Coherent  Orientation:  Full (Time, Place, and Person)  Thought Content:  WDL  Suicidal Thoughts:  No  Homicidal Thoughts:  No  Memory:  Immediate;   Fair Recent;   Fair Remote;   Fair  Judgement:  Fair  Insight:  Fair  Psychomotor Activity:  Normal  Concentration:  Concentration: Fair and Attention Span: Fair  Recall:  [employer] of Knowledge:Fair  Language: Fair  Akathisia:  No  Handed:  Right  AIMS (if indicated):    Assets:  Communication Skills Desire for Improvement Physical Health Social Support Vocational/Educational  ADL's:  Intact  Cognition: WNL  Sleep:  fair    Treatment Plan Summary: Bipolar 1 with psychosis  Continue Depakote at 500 mg in the morning and 750 mg at bedtime. Most recent Depakote level 2 weeks ago was 56 Restart Geodon at 20 mg twice daily. DisContinue Seroquel. Continue hydroxyzine 50 mg 1-3 times daily as needed for anxiety.  Cannabis use, severe Patient recommended to decrease  his use, educated about how it would interfere with this treatment and can lead to psychosis and paranoia.  Return to clinic in 1 month's time or call before if necessary.    Elvin So, MD 9/27/201710:05 AM

## 2016-06-02 ENCOUNTER — Ambulatory Visit: Payer: 59 | Admitting: Licensed Clinical Social Worker

## 2016-06-08 ENCOUNTER — Encounter: Payer: Self-pay | Admitting: Psychiatry

## 2016-06-08 ENCOUNTER — Ambulatory Visit (INDEPENDENT_AMBULATORY_CARE_PROVIDER_SITE_OTHER): Payer: 59 | Admitting: Psychiatry

## 2016-06-08 VITALS — BP 123/79 | HR 96 | Temp 98.5°F | Wt 207.8 lb

## 2016-06-08 DIAGNOSIS — F3110 Bipolar disorder, current episode manic without psychotic features, unspecified: Secondary | ICD-10-CM | POA: Diagnosis not present

## 2016-06-08 MED ORDER — RISPERIDONE 0.25 MG PO TABS
0.2500 mg | ORAL_TABLET | Freq: Two times a day (BID) | ORAL | 2 refills | Status: DC
Start: 2016-06-08 — End: 2016-06-16

## 2016-06-08 MED ORDER — DIVALPROEX SODIUM 500 MG PO DR TAB: DELAYED_RELEASE_TABLET | ORAL | 1 refills | Status: DC

## 2016-06-08 MED ORDER — HYDROXYZINE HCL 25 MG PO TABS: 25.0000 mg | ORAL_TABLET | Freq: Three times a day (TID) | ORAL | 1 refills | Status: DC | PRN

## 2016-06-08 NOTE — Progress Notes (Signed)
Psychiatric Progress note  Patient Identification: Bryce Leon MRN:  [id number] Date of Evaluation:  06/08/2016 Referral Source: [facility] Chief Complaint:   Chief Complaint    Follow-up; Medication Refill     Visit Diagnosis:  No diagnosis found.  History of Present Illness:  Patient is a 25 year old Caucasian male seen for a Follow-up of his bipolar disorder. Patient was accompanied by his mother today. [name] mom had called last week stating that patient was not doing well on the Geodon. At that time given patient's symptoms it was recommended that the patient be taken to the ER. However mom did not want to do that and try to find another psychiatrist. Mom had called back and taken another appointment with this clinician and they were seen today. Per patient he continues to have racing thoughts and stated that taking Geodon in the morning made him anxious. Stated that he was taking the Geodon 20 mg at bedtime along with the Depakote. On further probing his anxiety appears to be akathisia. We discussed taking him off the Geodon and he is okay with that. Patient unable to tell if the Depakote has been helpful to any extent. However he denies any side effects at this time from the Depakote. [name] main complaint is that he cannot focus and is having anxiety. Reports having racing thoughts and his moving from one task to the other. 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 He reports severe depression and states that there've been many issues ,most recently including breakup from his girlfriend. Patient also reports severe social anxiety and states that he does not like to go out. States that he closes his door and feels that people look down upon him because he does not have a job currently. He feels that people are talking about him.  Patient reports having suicidal thoughts and the plan sometimes about poisoning himself with  carbon monoxide but states that he does want to get better. He denies active suicidal thoughts today and is motivated to get better. He denies smoking any weed over the last 3 weeks. Denies use of alcohol or any other drugs.   Past Psychiatric History: Multiple hospitalizations since 2013 and most recently in September at [facility].  Previous Psychotropic Medications: Yes   Substance Abuse History in the last 12 months:  Yes.    Consequences of Substance Abuse: Medical Consequences:  psychosis   Past Medical History:  Past Medical History:  Diagnosis Date  . ADHD (attention deficit hyperactivity disorder)   . Anxiety   . Arthritis   . Bipolar disorder ([facility])   . Depression    History reviewed. No pertinent surgical history.  Family Psychiatric History: denies  Family History: History reviewed. No pertinent family history.  Social History:   Social History   Social History  . Marital status: Single    Spouse name: N/A  . Number of children: N/A  . Years of education: N/A   Social History Main Topics  . Smoking status: Current Every Day Smoker    Types: Cigarettes    Start date: 05/27/2005  . Smokeless tobacco: Former Neurosurgeon    Types: Snuff, Chew  . Alcohol use 16.8 oz/week    21 Cans of beer, 7 Shots of liquor per week  . Drug use:     Types: Marijuana     Comment: last used about 3 weeks ago  . Sexual activity: Not Currently  Other Topics Concern  . None   Social History Narrative  . None    Additional Social History: Living by himself with girlfriend  Allergies:  No Known Allergies  Metabolic Disorder Labs: No results found for: HGBA1C, MPG No results found for: PROLACTIN No results found for: CHOL, TRIG, HDL, CHOLHDL, VLDL, LDLCALC   Current Medications: Current Outpatient Prescriptions  Medication Sig Dispense Refill  . divalproex (DEPAKOTE) 250 MG DR tablet Take 3 tablets (750 mg total) by mouth daily. 90 tablet 1  . divalproex (DEPAKOTE) 500 MG  DR tablet TAKE 1 TABELT BY MOUTH DAILY 30 tablet 1   Current Facility-Administered Medications  Medication Dose Route Frequency Provider Last Rate Last Dose  . hydrOXYzine (ATARAX/VISTARIL) tablet 50 mg  50 mg Oral TID PRN Thanh Pomerleau, MD      . ziprasidone (GEODON) capsule 20 mg  20 mg Oral BID WC Mikhala Kenan, MD        Neurologic: Headache: No Seizure: No Paresthesias:No  Musculoskeletal: Strength & Muscle Tone: within normal limits Gait & Station: normal Patient leans: N/A  Psychiatric Specialty Exam: ROS  Blood pressure 123/79, pulse 96, temperature 98.5 F (36.9 C), temperature source Oral, weight 207 lb 12.8 oz (94.3 kg).Body mass index is 28.18 kg/m.  General Appearance: Casual  Eye Contact:  Fair  Speech:  coherent  Volume:  low  Mood:  Depressed and anxious  Affect:  Blunt  Thought Process:  Coherent  Orientation:  Full (Time, Place, and Person)  Thought Content:  WDL  Suicidal Thoughts:  No  Homicidal Thoughts:  No  Memory:  Immediate;   Fair Recent;   Fair Remote;   Fair  Judgement:  Fair  Insight:  Fair  Psychomotor Activity:  Normal  Concentration:  Concentration: Fair and Attention Span: Fair  Recall:  [employer]  Fund of Knowledge:Fair  Language: Fair  Akathisia:  No  Handed:  Right  AIMS (if indicated):    Assets:  Communication Skills Desire for Improvement Physical Health Social Support Vocational/Educational  ADL's:  Intact  Cognition: WNL  Sleep:  fair    Treatment Plan Summary: Bipolar 1 with psychosis  Discontinue Geodon Increase Depakote to 1000 milligrams in the morning and 1000 milligrams at bedtime Start Risperdal at 0.25 mg twice daily Continue hydroxyzine 25 mg 1-3 times daily as needed for anxiety. Start therapy and resources given Discussed with patient and his mother starting partial hospitalization but patient is not interested at this time  Cannabis use, severe Patient recommended to decrease his use, educated about  how it would interfere with this treatment and can lead to psychosis and paranoia.  Return to clinic in 1 week's time or call before if necessary. Safety plan discussed with both mom and patient and they're aware that if he has suicidal thoughts he calls mom or calls 911.    Ernst Cumpston, MD 10/9/20171:11 PM

## 2016-06-09 ENCOUNTER — Telehealth: Payer: Self-pay

## 2016-06-09 NOTE — Telephone Encounter (Signed)
pt was seen yesterday states he feels weak and faint feeling he feels like it is the depakote he wanted to see what he needs to do.

## 2016-06-10 NOTE — Telephone Encounter (Signed)
Message left for patient to call back to discuss his medications as called to follow up on reported concerns with medication.  Met with Dr. Einar Grad prior to attempt to contact patient with instruction if patient feeling week and faint, most likely due to started Risperdal if not taking Hydroxyzine, but if taking Hydroxyzine the would need to stop this as the probable reason.  Called to inform patient Depakote was unlikely to be the cause of his current symptoms.  Requested patient to call back to discuss as will first stop Hydroxyzine if patient taking, if not would then request he stop Risperdal per Dr. Einar Grad instructions.

## 2016-06-11 NOTE — Telephone Encounter (Signed)
pt mother states if you can send in zoloft.  that you had talked with him about it when he was last seen.

## 2016-06-11 NOTE — Telephone Encounter (Signed)
Can you please follow up with Bryce Leon and check again if he got the message from as yesterday about his medications.

## 2016-06-16 ENCOUNTER — Encounter: Payer: Self-pay | Admitting: Psychiatry

## 2016-06-16 ENCOUNTER — Ambulatory Visit (INDEPENDENT_AMBULATORY_CARE_PROVIDER_SITE_OTHER): Payer: 59 | Admitting: Psychiatry

## 2016-06-16 VITALS — BP 131/80 | HR 92 | Temp 97.6°F | Wt 205.8 lb

## 2016-06-16 DIAGNOSIS — F3110 Bipolar disorder, current episode manic without psychotic features, unspecified: Secondary | ICD-10-CM | POA: Diagnosis not present

## 2016-06-16 MED ORDER — SERTRALINE HCL 25 MG PO TABS: 25.0000 mg | ORAL_TABLET | Freq: Every day | ORAL | 2 refills | Status: DC

## 2016-06-16 NOTE — Progress Notes (Signed)
Psychiatric Progress note  Patient Identification: Bryce Leon MRN:  [id number] Date of Evaluation:  06/16/2016 Referral Source: [facility] Chief Complaint:   Chief Complaint    Follow-up; Medication Refill     Visit Diagnosis:    ICD-9-CM ICD-10-CM   1. Bipolar I disorder, most recent episode (or current) manic ([facility]) 296.40 F31.10     History of Present Illness:  Patient is a 25 year old Caucasian male seen for a Follow-up of his bipolar disorder. Patient was accompanied by his mother today. Patient reports that he's currently taking the Depakote at 500 mg in the morning and 750 mg at night. States that he has not increased the dose as recommended last visit. However he is feeling much better. Patient presented with a much brighter affect and voice was stronger. He had called stating that he was feeling dizzy last week and he was asked to discontinue the Risperdal. Currently patient is just taking the Depakote. Sleeping okay and eating okay. Patient reports that his main concern is his anxiety and panic attacks. States that he wakes up okay but then he has all these thoughts about his anxiety that race through his mind. Denies any suicidal thoughts today. States that his mom and him spoke a lot and he understands that mom is supportive. Mom also states that she feels like she does not have to walk around eggshells with him anymore. She reports that the relationship is improving and the [name] believes that his bipolar disorder can be better and feels more hopeful now.  Past Psychiatric History: Multiple hospitalizations since 2013 and most recently in September at [facility].  Previous Psychotropic Medications: Yes   Substance Abuse History in the last 12 months:  Yes.    Consequences of Substance Abuse: Medical Consequences:  psychosis   Past Medical History:  Past Medical History:  Diagnosis Date  . ADHD (attention deficit hyperactivity disorder)   . Anxiety   .  Arthritis   . Bipolar disorder ([facility])   . Depression    History reviewed. No pertinent surgical history.  Family Psychiatric History: denies  Family History: History reviewed. No pertinent family history.  Social History:   Social History   Social History  . Marital status: Single    Spouse name: N/A  . Number of children: N/A  . Years of education: N/A   Social History Main Topics  . Smoking status: Current Every Day Smoker    Types: Cigarettes    Start date: 05/27/2005  . Smokeless tobacco: Former Neurosurgeon    Types: Snuff, Chew  . Alcohol use 16.8 oz/week    21 Cans of beer, 7 Shots of liquor per week  . Drug use:     Types: Marijuana     Comment: last used about 3 weeks ago  . Sexual activity: Not Currently   Other Topics Concern  . None   Social History Narrative  . None    Additional Social History: Living by himself with girlfriend  Allergies:  No Known Allergies  Metabolic Disorder Labs: No results found for: HGBA1C, MPG No results found for: PROLACTIN No results found for: CHOL, TRIG, HDL, CHOLHDL, VLDL, LDLCALC   Current Medications: Current Outpatient Prescriptions  Medication Sig Dispense Refill  . divalproex (DEPAKOTE) 500 MG DR tablet Take 2 tablets by mouth twice daily 60 tablet 1  . hydrOXYzine (ATARAX/VISTARIL) 25 MG tablet Take 1 tablet (25 mg total) by mouth 3 (three) times daily as needed. 30 tablet 1  .  sertraline (ZOLOFT) 25 MG tablet Take 1 tablet (25 mg total) by mouth daily. 30 tablet 2   No current facility-administered medications for this visit.     Neurologic: Headache: No Seizure: No Paresthesias:No  Musculoskeletal: Strength & Muscle Tone: within normal limits Gait & Station: normal Patient leans: N/A  Psychiatric Specialty Exam: ROS  Blood pressure 131/80, pulse 92, temperature 97.6 F (36.4 C), temperature source Oral, weight 205 lb 12.8 oz (93.4 kg).Body mass index is 27.91 kg/m.  General Appearance: Casual  Eye  Contact:  Fair  Speech:  coherent  Volume:  normal  Mood: Anxious but brighter than last week   Affect:  Congruent   Thought Process:  Coherent  Orientation:  Full (Time, Place, and Person)  Thought Content:  WDL  Suicidal Thoughts:  No  Homicidal Thoughts:  No  Memory:  Immediate;   Fair Recent;   Fair Remote;   Fair  Judgement:  Fair  Insight:  Fair  Psychomotor Activity:  Normal  Concentration:  Concentration: Fair and Attention Span: Fair  Recall:  [employer]  Fund of Knowledge:Fair  Language: Fair  Akathisia:  No  Handed:  Right  AIMS (if indicated):    Assets:  Communication Skills Desire for Improvement Physical Health Social Support Vocational/Educational  ADL's:  Intact  Cognition: WNL  Sleep:  fair    Treatment Plan Summary: Bipolar 1 with psychosis  Change Depakote back to 500 mg in the morning and 750 mg at bedtime Discontinue Risperdal Continue hydroxyzine 25 mg 1-3 times daily as needed for anxiety. Start therapy and resources given  Anxiety Start Zoloft at 12.5 mg once daily for 3 days and then increase to 25 mg once daily. Patient and mom educated about risk of manic symptoms with the Zoloft and to monitor closely.  Cannabis use, severe Patient recommended to decrease his use, educated about how it would interfere with this treatment and can lead to psychosis and paranoia.  Return to clinic in 1 week's time or call before if necessary. Safety plan discussed with both mom and patient and they're aware that if he has suicidal thoughts he calls mom or calls 911.    Patrick NorthAVI, Daryel Kenneth, MD 10/17/201711:37 AM

## 2016-06-23 ENCOUNTER — Ambulatory Visit (INDEPENDENT_AMBULATORY_CARE_PROVIDER_SITE_OTHER): Payer: 59 | Admitting: Psychiatry

## 2016-06-23 ENCOUNTER — Encounter: Payer: Self-pay | Admitting: Psychiatry

## 2016-06-23 VITALS — BP 120/71 | HR 90 | Temp 98.3°F | Wt 205.4 lb

## 2016-06-23 DIAGNOSIS — F3110 Bipolar disorder, current episode manic without psychotic features, unspecified: Secondary | ICD-10-CM | POA: Diagnosis not present

## 2016-06-23 MED ORDER — QUETIAPINE FUMARATE 25 MG PO TABS: 25.0000 mg | ORAL_TABLET | Freq: Two times a day (BID) | ORAL | 2 refills | Status: DC

## 2016-06-23 NOTE — Progress Notes (Signed)
Psychiatric Progress note  Patient Identification: Bryce DoingRobert B Meany MRN:  [id number] Date of Evaluation:  06/23/2016 Referral Source: [facility] Chief Complaint:    Visit Diagnosis:  No diagnosis found.  History of Present Illness:  Patient is a 25 year old Caucasian male seen for a Follow-up of his bipolar disorder. Patient today reports that he has been feeling very agitated and anxious. He has been able to tolerate the Z[place]ft from [facility] 0.5[facility]25 mg. However states that it does not help with his anxiety. Unable to say if his anxiety has worsened. He does describe having racing thoughts but is not very communicative. [name] body language is slow and patient sits there without much movement. Denies using any cannabis. He denies any suicidal thoughts or homicidal thoughts. Denies any psychotic symptoms. His mom  states that he has seen the same but more agitated this week.   Past Psychiatric History: Multiple hospitalizations since 2013 and most recently in September at [facility].  Previous Psychotropic Medications: Yes   Substance Abuse History in the last 12 months:  Yes.    Consequences of Substance Abuse: Medical Consequences:  psychosis   Past Medical History:  Past Medical History:  Diagnosis Date  . ADHD (attention deficit hyperactivity disorder)   . Anxiety   . Arthritis   . Bipolar disorder (HCC)   . Depression    No past surgical history on file.  Family Psychiatric History: denies  Family History: No family history on file.  Social History:   Social History   Social History  . Marital status: Single    Spouse name: N/A  . Number of children: N/A  . Years of education: N/A   Social History Main Topics  . Smoking status: Current Every Day Smoker    Types: Cigarettes    Start date: 05/27/2005  . Smokeless tobacco: Former NeurosurgeonUser    Types: Snuff, Chew  . Alcohol use 16.8 oz/week    21 Cans of beer, 7 Shots of liquor per week  . Drug use:     Types:  Marijuana     Comment: last used about 3 weeks ago  . Sexual activity: Not Currently   Other Topics Concern  . Not on file   Social History Narrative  . No narrative on file    Additional Social History: Living by himself with girlfriend  Allergies:  No Known Allergies  Metabolic Disorder Labs: No results found for: HGBA1C, MPG No results found for: PROLACTIN No results found for: CHOL, TRIG, HDL, CHOLHDL, VLDL, LDLCALC   Current Medications: Current Outpatient Prescriptions  Medication Sig Dispense Refill  . divalproex (DEPAKOTE) 500 MG DR tablet Take 2 tablets by mouth twice daily 60 tablet 1  . hydrOXYzine (ATARAX/VISTARIL) 25 MG tablet Take 1 tablet (25 mg total) by mouth 3 (three) times daily as needed. 30 tablet 1  . sertraline (ZOLOFT) 25 MG tablet Take 1 tablet (25 mg total) by mouth daily. 30 tablet 2   No current facility-administered medications for this visit.     Neurologic: Headache: No Seizure: No Paresthesias:No  Musculoskeletal: Strength & Muscle Tone: within normal limits Gait & Station: normal Patient leans: N/A  Psychiatric Specialty Exam: ROS  There were no vitals taken for this visit.There is no height or weight on file to calculate BMI.  General Appearance: Casual  Eye Contact:  Fair  Speech:  coherent  Volume:  normal  Mood: Irritable   Affect: Anxious   Thought Process:  Coherent  Orientation:  Full (  Time, Place, and Person)  Thought Content:  WDL  Suicidal Thoughts:  No  Homicidal Thoughts:  No  Memory:  Immediate;   Fair Recent;   Fair Remote;   Fair  Judgement:  Fair  Insight:  Fair  Psychomotor Activity:  Normal  Concentration:  Concentration: Fair and Attention Span: Fair  Recall:  [employer] of Knowledge:Fair  Language: Fair  Akathisia:  No  Handed:  Right  AIMS (if indicated):    Assets:  Communication Skills Desire for Improvement Physical Health Social Support Vocational/Educational  ADL's:  Intact  Cognition:  WNL  Sleep:  fair    Treatment Plan Summary: Bipolar 1 with Recent episode mixed  Increase Depakote to 1000mg  po bid. Continue hydroxyzine 25 mg 1-3 times daily as needed for anxiety. Start therapy and resources given  Anxiety Start seroquel at 25mg  po bid. patient instructed to take 25 mg at bedtime first and see how he does. He is also instructed to take 50 mg at bedtime if he feels the 25 is not working.  Cannabis use, severe Patient recommended to decrease his use, educated about how it would interfere with this treatment and can lead to psychosis and paranoia.  Return to clinic in 1 week's time or call before if necessary. Safety plan discussed with both mom and patient and they're aware that if he has suicidal thoughts he calls mom or calls 911.    Terisha Losasso, MD 10/24/201710:56 AM

## 2016-06-30 ENCOUNTER — Ambulatory Visit: Payer: 59 | Admitting: Psychiatry

## 2017-02-12 ENCOUNTER — Encounter: Payer: Self-pay | Admitting: Internal Medicine

## 2017-03-29 ENCOUNTER — Ambulatory Visit: Payer: Self-pay | Admitting: Internal Medicine

## 2017-05-19 ENCOUNTER — Ambulatory Visit: Payer: Self-pay | Admitting: Internal Medicine

## 2021-01-15 ENCOUNTER — Encounter (HOSPITAL_COMMUNITY): Payer: Self-pay | Admitting: Emergency Medicine

## 2021-01-15 ENCOUNTER — Other Ambulatory Visit: Payer: Self-pay

## 2021-01-15 ENCOUNTER — Emergency Department (HOSPITAL_COMMUNITY)
Admission: EM | Admit: 2021-01-15 | Discharge: 2021-01-15 | Disposition: A | Discharge status: Home / Self Care | Payer: Self-pay | Attending: Emergency Medicine | Admitting: Emergency Medicine

## 2021-01-15 DIAGNOSIS — R030 Elevated blood-pressure reading, without diagnosis of hypertension: Secondary | ICD-10-CM

## 2021-01-15 DIAGNOSIS — F419 Anxiety disorder, unspecified: Secondary | ICD-10-CM | POA: Insufficient documentation

## 2021-01-15 DIAGNOSIS — I10 Essential (primary) hypertension: Secondary | ICD-10-CM | POA: Insufficient documentation

## 2021-01-15 DIAGNOSIS — F32A Depression, unspecified: Secondary | ICD-10-CM | POA: Insufficient documentation

## 2021-01-15 DIAGNOSIS — Z8659 Personal history of other mental and behavioral disorders: Secondary | ICD-10-CM

## 2021-01-15 DIAGNOSIS — F17210 Nicotine dependence, cigarettes, uncomplicated: Secondary | ICD-10-CM | POA: Insufficient documentation

## 2021-01-15 LAB — COMPREHENSIVE METABOLIC PANEL
ALT: 63 U/L — ABNORMAL HIGH (ref 0–44)
AST: 40 U/L (ref 15–41)
Albumin: 4.4 g/dL (ref 3.5–5.0)
Alkaline Phosphatase: 77 U/L (ref 38–126)
Anion gap: 12 (ref 5–15)
BUN: 11 mg/dL (ref 6–20)
CO2: 21 mmol/L — ABNORMAL LOW (ref 22–32)
Calcium: 9.6 mg/dL (ref 8.9–10.3)
Chloride: 102 mmol/L (ref 98–111)
Creatinine, Ser: 1.13 mg/dL (ref 0.61–1.24)
GFR, Estimated: >60 mL/min (ref >60)
Glucose, Bld: 131 mg/dL — ABNORMAL HIGH (ref 70–99)
Potassium: 3.6 mmol/L (ref 3.5–5.1)
Sodium: 135 mmol/L (ref 135–145)
Total Bilirubin: 1 mg/dL (ref 0.3–1.2)
Total Protein: 7.7 g/dL (ref 6.5–8.1)

## 2021-01-15 LAB — RAPID URINE DRUG SCREEN, HOSP PERFORMED
Amphetamines: NOT DETECTED
Barbiturates: NOT DETECTED
Benzodiazepines: NOT DETECTED
Cocaine: NOT DETECTED
Opiates: NOT DETECTED
Tetrahydrocannabinol: POSITIVE — AB

## 2021-01-15 LAB — CBC WITH DIFFERENTIAL/PLATELET
Abs Immature Granulocytes: 0.05 10*3/uL (ref 0.00–0.07)
Basophils Absolute: 0 10*3/uL (ref 0.0–0.1)
Basophils Relative: 0 %
Eosinophils Absolute: 0 10*3/uL (ref 0.0–0.5)
Eosinophils Relative: 0 %
HCT: 46.5 % (ref 39.0–52.0)
Hemoglobin: 16.5 g/dL (ref 13.0–17.0)
Immature Granulocytes: 0 %
Lymphocytes Relative: 9 %
Lymphs Abs: 1.4 10*3/uL (ref 0.7–4.0)
MCH: 30.4 pg (ref 26.0–34.0)
MCHC: 35.5 g/dL (ref 30.0–36.0)
MCV: 85.6 fL (ref 80.0–100.0)
Monocytes Absolute: 0.9 10*3/uL (ref 0.1–1.0)
Monocytes Relative: 6 %
Neutro Abs: 12.8 10*3/uL — ABNORMAL HIGH (ref 1.7–7.7)
Neutrophils Relative %: 85 %
Platelets: 272 10*3/uL (ref 150–400)
RBC: 5.43 MIL/uL (ref 4.22–5.81)
RDW: 12.2 % (ref 11.5–15.5)
WBC: 15.2 10*3/uL — ABNORMAL HIGH (ref 4.0–10.5)
nRBC: 0 % (ref 0.0–0.2)

## 2021-01-15 LAB — ETHANOL: Alcohol, Ethyl (B): <10 mg/dL (ref <10)
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
## 2021-01-15 NOTE — ED Provider Notes (Addendum)
[facility] EMERGENCY DEPARTMENT Provider Note   CSN: [id number] Arrival date & time: 01/15/21  1446     History Chief Complaint  Patient presents with  . Anxiety  . Depression    Bryce Leon is a 30 y.o. male.  Patient with hx bipolar disorder, c/o having scattered thoughts, and periods of feeling anxious and depressed. Symptoms gradual onset in past weeks, waxing and waning, mild-moderate.  States quit taking his meds long ago b/c he did not like how they made him feel. Occasional etoh and thc use, denies daily or frequent use. Physical health at baseline, no acute symptoms. Normal appetite. No wt loss. Is sleeping at night in general but at times feels restless. Denies SI/HI. States lots of stressors but denies specific inciting or stressful event.   The history is provided by the patient.       Past Medical History:  Diagnosis Date  . ADHD (attention deficit hyperactivity disorder)   . Anxiety   . Arthritis   . Bipolar disorder (HCC)   . Depression     Patient Active Problem List   Diagnosis Date Noted  . Infected pilonidal cyst 02/05/2014  . HTN (hypertension) 12/03/2011  . 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 Altered mental status 12/02/2011  . Acute psychosis (HCC) 12/02/2011  . Substance abuse (HCC) 12/02/2011  . Leukocytosis 12/02/2011    History reviewed. No pertinent surgical history.     History reviewed. No pertinent family history.  Social History   Tobacco Use  . Smoking status: Current Every Day Smoker    Types: Cigarettes    Start date: 05/27/2005  . Smokeless tobacco: Former Neurosurgeon    Types: Snuff, Chew  Substance Use Topics  . Alcohol use: Yes    Alcohol/week: 28.0 standard drinks    Types: 21 Cans of beer, 7 Shots of liquor per week  . Drug use: Yes    Types: Marijuana    Comment: last used about 3 weeks ago    Home Medications Prior to Admission medications   Medication  Sig Start Date End Date Taking? Authorizing Provider  divalproex (DEPAKOTE) 500 MG DR tablet Take 2 tablets by mouth twice daily 06/08/16   Patrick North, MD  hydrOXYzine (ATARAX/VISTARIL) 25 MG tablet Take 1 tablet (25 mg total) by mouth 3 (three) times daily as needed. 06/08/16   Patrick North, MD  QUEtiapine (SEROQUEL) 25 MG tablet Take 1 tablet (25 mg total) by mouth 2 (two) times daily. 06/23/16 06/23/17  Patrick North, MD    Allergies    Patient has no known allergies.  Review of Systems   Review of Systems  Constitutional: Negative for chills and fever.  HENT: Negative for sore throat.   Eyes: Negative for redness.  Respiratory: Negative for cough and shortness of breath.   Cardiovascular: Negative for chest pain.  Gastrointestinal: Negative for abdominal pain.  Genitourinary: Negative for flank pain.  Musculoskeletal: Negative for neck pain.  Skin: Negative for rash.  Neurological: Negative for headaches.  Hematological: Does not bruise/bleed easily.  Psychiatric/Behavioral: Positive for dysphoric mood. The patient is nervous/anxious.     Physical Exam Updated Vital Signs BP (!) 134/99   Pulse 93   Temp 98.8 F (37.1 C) (Oral)   SpO2 98%   Physical Exam Vitals and nursing note reviewed.  Constitutional:      Appearance: Normal appearance. He is well-developed.  HENT:     Head: Atraumatic.  Nose: Nose normal.     Mouth/Throat:     Mouth: Mucous membranes are moist.     Pharynx: Oropharynx is clear.  Eyes:     General: No scleral icterus.    Conjunctiva/sclera: Conjunctivae normal.     Pupils: Pupils are equal, round, and reactive to light.  Neck:     Trachea: No tracheal deviation.     Comments: Thyroid not grossly enlarged or tender.  Cardiovascular:     Rate and Rhythm: Normal rate and regular rhythm.     Pulses: Normal pulses.     Heart sounds: Normal heart sounds. No murmur heard. No friction rub. No gallop.   Pulmonary:     Effort: Pulmonary  effort is normal. No accessory muscle usage or respiratory distress.     Breath sounds: Normal breath sounds.  Abdominal:     General: Bowel sounds are normal. There is no distension.     Palpations: Abdomen is soft.     Tenderness: There is no abdominal tenderness.  Genitourinary:    Comments: No cva tenderness. Musculoskeletal:        General: No swelling.     Cervical back: Normal range of motion and neck supple. No rigidity.  Skin:    General: Skin is warm and dry.     Findings: No rash.  Neurological:     Mental Status: He is alert.     Comments: Alert, speech clear. Steady gait.   Psychiatric:     Comments: Mildly anxious, very talkative - tends to shift from one unrelated topic to next. Does not appear acutely depressed. No hallucinations. No SI/HI.      ED Results / Procedures / Treatments   Labs (all labs ordered are listed, but only abnormal results are displayed) Results for orders placed or performed during the hospital encounter of 01/15/21  Comprehensive metabolic panel  Result Value Ref Range   Sodium 135 135 - 145 mmol/L   Potassium 3.6 3.5 - 5.1 mmol/L   Chloride 102 98 - 111 mmol/L   CO2 21 (L) 22 - 32 mmol/L   Glucose, Bld 131 (H) 70 - 99 mg/dL   BUN 11 6 - 20 mg/dL   Creatinine, Ser 5.36 0.61 - 1.24 mg/dL   Calcium 9.6 8.9 - 64.4 mg/dL   Total Protein 7.7 6.5 - 8.1 g/dL   Albumin 4.4 3.5 - 5.0 g/dL   AST 40 15 - 41 U/L   ALT 63 (H) 0 - 44 U/L   Alkaline Phosphatase 77 38 - 126 U/L   Total Bilirubin 1.0 0.3 - 1.2 mg/dL   GFR, Estimated >03 >47 mL/min   Anion gap 12 5 - 15  Ethanol  Result Value Ref Range   Alcohol, Ethyl (B) <10 <10 mg/dL  Urine rapid drug screen (hosp performed)  Result Value Ref Range   Opiates NONE DETECTED NONE DETECTED   Cocaine NONE DETECTED NONE DETECTED   Benzodiazepines NONE DETECTED NONE DETECTED   Amphetamines NONE DETECTED NONE DETECTED   Tetrahydrocannabinol POSITIVE (A) NONE DETECTED   Barbiturates NONE DETECTED  NONE DETECTED  CBC with Diff  Result Value Ref Range   WBC 15.2 (H) 4.0 - 10.5 K/uL   RBC 5.43 4.22 - 5.81 MIL/uL   Hemoglobin 16.5 13.0 - 17.0 g/dL   HCT 42.5 95.6 - 38.7 %   MCV 85.6 80.0 - 100.0 fL   MCH 30.4 26.0 - 34.0 pg   MCHC 35.5 30.0 - 36.0 g/dL   RDW  12.2 11.5 - 15.5 %   Platelets 272 150 - 400 K/uL   nRBC 0.0 0.0 - 0.2 %   Neutrophils Relative % 85 %   Neutro Abs 12.8 (H) 1.7 - 7.7 K/uL   Lymphocytes Relative 9 %   Lymphs Abs 1.4 0.7 - 4.0 K/uL   Monocytes Relative 6 %   Monocytes Absolute 0.9 0.1 - 1.0 K/uL   Eosinophils Relative 0 %   Eosinophils Absolute 0.0 0.0 - 0.5 K/uL   Basophils Relative 0 %   Basophils Absolute 0.0 0.0 - 0.1 K/uL   Immature Granulocytes 0 %   Abs Immature Granulocytes 0.05 0.00 - 0.07 K/uL    EKG None  Radiology No results found.  Procedures Procedures   Medications Ordered in ED Medications - No data to display  ED Course  I have reviewed the triage vital signs and the nursing notes.  Pertinent labs & imaging results that were available during my care of the patient were reviewed by me and considered in my medical decision making (see chart for details).    MDM Rules/Calculators/A&P                         Labs sent.   Reviewed nursing notes and prior charts for additional history.   BH team consulted - it appears pt may benefit from assessment and establishment of outpatient follow up as currently indicates has no psychiatrist or therapist.   Labs reviewed/interpreted by me - chem normal.   BH evaluation pending - disposition per [facility] team.   The patient has been placed in psychiatric observation due to the need to provide a safe environment for the patient while obtaining psychiatric consultation and evaluation, as well as ongoing medical and medication management to treat the patient's condition.    Recheck pt as pt requests to leave, states is feeling improved and doesn't want to wait for [facility] evaluation.  Recheck pt  - calm and alert. Pt states feeling less anxious, improved and requests d/c. Denies any pain or discomfort. No sob. No fever/chills. Denies SI or thoughts of harm to self.  Pt is alert, oriented, not responding to internal stimuli, no acute psychosis, and persists is desire to be discharge.   Pt discharged per requests. Encouraged pt that he may f/u closely at [facility] - pt indicates he will.  Return precautions provided.      Final Clinical Impression(s) / ED Diagnoses Final diagnoses:  None    Rx / DC Orders ED Discharge Orders    None          Cathren Laine, MD 01/15/21 1754

## 2021-01-15 NOTE — Discharge Instructions (Addendum)
It was our pleasure to provide your ER care today - we hope that you feel better.  For mental health issues and/or crisis, you may go directly to the [facility] - it is open 24/7, and walk-ins are welcome.   Your blood pressure is high today - follow up with primary care doctor in 1-2 weeks.   Return to ER if worse, new symptoms, new or severe pain, fevers, trouble breathing, or other emergency concern.

## 2021-01-16 ENCOUNTER — Encounter (HOSPITAL_COMMUNITY): Payer: Self-pay | Admitting: Emergency Medicine

## 2021-01-17 ENCOUNTER — Emergency Department (HOSPITAL_COMMUNITY)
Admission: EM | Admit: 2021-01-17 | Discharge: 2021-01-22 | Disposition: A | Discharge status: Home / Self Care | Payer: Self-pay | Attending: Emergency Medicine | Admitting: Emergency Medicine

## 2021-01-17 ENCOUNTER — Encounter (HOSPITAL_COMMUNITY): Payer: Self-pay

## 2021-01-17 ENCOUNTER — Other Ambulatory Visit: Payer: Self-pay

## 2021-01-17 DIAGNOSIS — F319 Bipolar disorder, unspecified: Secondary | ICD-10-CM | POA: Diagnosis present

## 2021-01-17 DIAGNOSIS — F1220 Cannabis dependence, uncomplicated: Secondary | ICD-10-CM | POA: Insufficient documentation

## 2021-01-17 DIAGNOSIS — R451 Restlessness and agitation: Secondary | ICD-10-CM | POA: Insufficient documentation

## 2021-01-17 DIAGNOSIS — F17210 Nicotine dependence, cigarettes, uncomplicated: Secondary | ICD-10-CM | POA: Insufficient documentation

## 2021-01-17 DIAGNOSIS — I10 Essential (primary) hypertension: Secondary | ICD-10-CM | POA: Insufficient documentation

## 2021-01-17 DIAGNOSIS — F312 Bipolar disorder, current episode manic severe with psychotic features: Secondary | ICD-10-CM | POA: Insufficient documentation

## 2021-01-17 DIAGNOSIS — F23 Brief psychotic disorder: Secondary | ICD-10-CM | POA: Diagnosis present

## 2021-01-17 DIAGNOSIS — Z20822 Contact with and (suspected) exposure to covid-19: Secondary | ICD-10-CM | POA: Insufficient documentation

## 2021-01-17 LAB — CBC WITH DIFFERENTIAL/PLATELET
Abs Immature Granulocytes: 0.03 10*3/uL (ref 0.00–0.07)
Basophils Absolute: 0 10*3/uL (ref 0.0–0.1)
Basophils Relative: 1 %
Eosinophils Absolute: 0 10*3/uL (ref 0.0–0.5)
Eosinophils Relative: 0 %
HCT: 44 % (ref 39.0–52.0)
Hemoglobin: 15.9 g/dL (ref 13.0–17.0)
Immature Granulocytes: 0 %
Lymphocytes Relative: 17 %
Lymphs Abs: 1.4 10*3/uL (ref 0.7–4.0)
MCH: 30.5 pg (ref 26.0–34.0)
MCHC: 36.1 g/dL — ABNORMAL HIGH (ref 30.0–36.0)
MCV: 84.3 fL (ref 80.0–100.0)
Monocytes Absolute: 0.7 10*3/uL (ref 0.1–1.0)
Monocytes Relative: 9 %
Neutro Abs: 6.2 10*3/uL (ref 1.7–7.7)
Neutrophils Relative %: 73 %
Platelets: 197 10*3/uL (ref 150–400)
RBC: 5.22 MIL/uL (ref 4.22–5.81)
RDW: 12.2 % (ref 11.5–15.5)
WBC: 8.5 10*3/uL (ref 4.0–10.5)
nRBC: 0 % (ref 0.0–0.2)

## 2021-01-17 LAB — RAPID URINE DRUG SCREEN, HOSP PERFORMED
Amphetamines: NOT DETECTED
Barbiturates: NOT DETECTED
Benzodiazepines: POSITIVE — AB
Cocaine: NOT DETECTED
Opiates: NOT DETECTED
Tetrahydrocannabinol: POSITIVE — AB

## 2021-01-17 LAB — COMPREHENSIVE METABOLIC PANEL
ALT: 49 U/L — ABNORMAL HIGH (ref 0–44)
AST: 30 U/L (ref 15–41)
Albumin: 4 g/dL (ref 3.5–5.0)
Alkaline Phosphatase: 64 U/L (ref 38–126)
Anion gap: 9 (ref 5–15)
BUN: 5 mg/dL — ABNORMAL LOW (ref 6–20)
CO2: 23 mmol/L (ref 22–32)
Calcium: 9.1 mg/dL (ref 8.9–10.3)
Chloride: 105 mmol/L (ref 98–111)
Creatinine, Ser: 0.83 mg/dL (ref 0.61–1.24)
GFR, Estimated: >60 mL/min (ref >60)
Glucose, Bld: 101 mg/dL — ABNORMAL HIGH (ref 70–99)
Potassium: 3.3 mmol/L — ABNORMAL LOW (ref 3.5–5.1)
Sodium: 137 mmol/L (ref 135–145)
Total Bilirubin: 0.8 mg/dL (ref 0.3–1.2)
Total Protein: 7 g/dL (ref 6.5–8.1)

## 2021-01-17 LAB — RESP PANEL BY RT-PCR (FLU A&B, COVID) ARPGX2
Influenza A by PCR: NEGATIVE
Influenza B by PCR: NEGATIVE
SARS Coronavirus 2 by RT PCR: NEGATIVE

## 2021-01-17 LAB — ETHANOL: Alcohol, Ethyl (B): <10 mg/dL (ref <10)

## 2021-01-17 MED ORDER — ONDANSETRON HCL 4 MG PO TABS: 4.0000 mg | ORAL_TABLET | Freq: Three times a day (TID) | ORAL | Status: DC | PRN

## 2021-01-17 MED ORDER — LORAZEPAM 2 MG/ML IJ SOLN
1.0000 mg | Freq: Once | INTRAMUSCULAR | Status: AC
  Administered 2021-01-17: 1 mg via INTRAMUSCULAR
  Filled 2021-01-17: qty 1

## 2021-01-17 MED ORDER — ZIPRASIDONE MESYLATE 20 MG IM SOLR
20.0000 mg | Freq: Once | INTRAMUSCULAR | Status: AC
  Administered 2021-01-18: 20 mg via INTRAMUSCULAR
  Filled 2021-01-17: qty 20

## 2021-01-17 MED ORDER — ZIPRASIDONE MESYLATE 20 MG IM SOLR
20.0000 mg | Freq: Once | INTRAMUSCULAR | Status: AC
  Administered 2021-01-17: 20 mg via INTRAMUSCULAR
  Filled 2021-01-17: qty 20

## 2021-01-17 MED ORDER — NICOTINE 21 MG/24HR TD PT24
21.0000 mg | MEDICATED_PATCH | Freq: Once | TRANSDERMAL | Status: AC
  Administered 2021-01-17: 21 mg via TRANSDERMAL
  Filled 2021-01-17: qty 1

## 2021-01-17 MED ORDER — OXYCODONE HCL 5 MG PO TABS
ORAL_TABLET | ORAL | Status: AC
  Filled 2021-01-17: qty 1

## 2021-01-17 MED ORDER — STERILE WATER FOR INJECTION IJ SOLN
INTRAMUSCULAR | Status: AC
  Filled 2021-01-17: qty 10

## 2021-01-17 MED ORDER — ZOLPIDEM TARTRATE 5 MG PO TABS
5.0000 mg | ORAL_TABLET | Freq: Every evening | ORAL | Status: DC | PRN
  Filled 2021-01-17: qty 1

## 2021-01-17 MED ORDER — LORAZEPAM 2 MG/ML IJ SOLN
2.0000 mg | INTRAMUSCULAR | Status: DC | PRN
  Administered 2021-01-17 – 2021-01-18 (×5): 2 mg via INTRAMUSCULAR
  Filled 2021-01-17 (×5): qty 1

## 2021-01-17 MED ORDER — ACETAMINOPHEN 325 MG PO TABS
650.0000 mg | ORAL_TABLET | ORAL | Status: DC | PRN
  Administered 2021-01-18: 650 mg via ORAL
  Filled 2021-01-17: qty 2

## 2021-01-17 MED ORDER — METHOCARBAMOL 500 MG PO TABS
ORAL_TABLET | ORAL | Status: AC
  Filled 2021-01-17: qty 1
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
## 2021-01-17 NOTE — ED Notes (Signed)
Pt now in room "fuck all y'all, you better just turn all your badges". Pt continues to cuss to security.

## 2021-01-17 NOTE — BH Assessment (Addendum)
Comprehensive Clinical Assessment (CCA) Note  01/17/2021 Bryce Leon [id number]   Disposition: Doran Heater, FNP recommends in patient treatment. 2:1 sitter is recommended for suicide safety precautions. Mardella Layman, RN/AC at [facility] reviewing for placement.  The patient demonstrates the following risk factors for suicide: Chronic risk factors for suicide include: psychiatric disorder of bipolar, substance use disorder and demographic factors (male, >54 y/o). Acute risk factors for suicide include: N/A. Protective factors for this patient include: positive social support. Considering these factors, the overall suicide risk at this point appears to be low. Patient is not appropriate for outpatient follow up.  Flowsheet Row ED from 01/17/2021 in [facility] EMERGENCY DEPARTMENT ED from 01/15/2021 in [facility] EMERGENCY DEPARTMENT  C-SSRS RISK CATEGORY No Risk No Risk     Patient is a 30 year old male presenting voluntarily to [facility] ED via EMS. He has since been placed under IVC by EDP due to agitation. Patient required IM Geodon and Ativan. Upon this counselor's exam patient is cooperative, however is a limited historian due to AMS and has a labile affect. Patient states "I think I was drugged.I felt like I couldn't breathe and the world was spinning." Patient offers contradictory information at times and he appears confused. First he states he does not know how or why he got to the hospital. Later in assessment he tells me the police and EMS came to his work because he was showering there. Per triage note he was "hosing himself down" in the parking lot. Patient denies current SI/HI/AVH. He does endorse paranoia and delusional thoughts. Upon arrival to the ED he was fearful ED staff were trying to harm him and that they were trying to hook something up to him to "read me through the chip." He reports a history of Bipolar I with psychosis, however, he has not had any psych treatment  in a couple years. In the past he reports taking 500 mg Depakote, 25 mg Seroquel, and 25 mg Visatril, but discontinued these because "they made me feel funny." Patient reports using THC on a daily basis but denies using any other substances. He gives verbal consent for [facility] to speak with his girlfriend, Merry Proud, for collateral information if necessary.  Chief Complaint:  Chief Complaint  Patient presents with  . Agitation  . Mental Health Problem   Visit Diagnosis: F31.2 Bipolar I current episode manic, with psychosis     F12.20 Cannabis use disorder, moderate  CCA Biopsychosocial Intake/Chief Complaint:  NA  Current Symptoms/Problems: NA   Patient Reported Schizophrenia/Schizoaffective Diagnosis in Past: No   Strengths: NA  Preferences: NA  Abilities: NA   Type of Services Patient Feels are Needed: NA   Initial Clinical Notes/Concerns: NA   Mental Health Symptoms Depression:  Change in energy/activity; Difficulty Concentrating; Fatigue; Hopelessness; Increase/decrease in appetite; Irritability; Sleep (too much or little); Tearfulness; Weight gain/loss; Worthlessness   Duration of Depressive symptoms: Greater than two weeks   Mania:  Change in energy/activity; Increased Energy; Irritability; Racing thoughts; Recklessness   Anxiety:   None   Psychosis:  Delusions   Duration of Psychotic symptoms: Less than six months   Trauma:  None   Obsessions:  None   Compulsions:  None   Inattention:  N/A   Hyperactivity/Impulsivity:  N/A   Oppositional/Defiant Behaviors:  N/A   Emotional Irregularity:  N/A   Other Mood/Personality Symptoms:  No data recorded   Mental Status Exam Appearance and self-care  Stature:  Average  Weight:  Average weight   Clothing:  Neat/clean   Grooming:  Normal   Cosmetic use:  None   Posture/gait:  Normal   Motor activity:  Not Remarkable   Sensorium  Attention:  Confused   Concentration:  Scattered   Orientation:   X5   Recall/memory:  No data recorded  Affect and Mood  Affect:  Labile   Mood:  Depressed; Anxious   Relating  Eye contact:  Normal   Facial expression:  Anxious; Responsive   Attitude toward examiner:  Cooperative   Thought and Language  Speech flow: Slow; Clear and Coherent   Thought content:  Appropriate to Mood and Circumstances   Preoccupation:  None   Hallucinations:  None   Organization:  No data recorded  [employer] of Knowledge:  Good   Intelligence:  Average   Abstraction:  Normal   Judgement:  Impaired   Reality Testing:  Distorted   Insight:  Poor   Decision Making:  Impulsive   Social Functioning  Social Maturity:  Impulsive   Social Judgement:  Heedless   Stress  Stressors:  Illness; Work   Coping Ability:  Deficient supports   Skill Deficits:  Aeronautical engineer; Responsibility; Self-control   Supports:  Friends/Service system     Religion: Religion/Spirituality Are You A Religious Person?: No  Leisure/Recreation: Leisure / Recreation Do You Have Hobbies?: No  Exercise/Diet: Exercise/Diet Do You Exercise?: No Have You Gained or Lost A Significant Amount of Weight in the Past Six Months?: No Do You Follow a Special Diet?: No Do You Have Any Trouble Sleeping?: Yes Explanation of Sleeping Difficulties: patient is unclear about this. He repeatedly states he needs sleep and is tired from not sleeping, but also reports sleeping 6-9 hrs per night   CCA Employment/Education Employment/Work Situation: Employment / Work Situation Employment situation: Employed Where is patient currently employed?: [employer] long has patient been employed?: UTA [name] job has been impacted by current illness: Yes Describe how patient's job has been impacted: patient states police picked him up from work this AM- showering in the back parking lot What is the longest time patient has a held a job?: UTA Where was the  patient employed at that time?: [employer] Has patient ever been in the [employer]?: No  Education: Education Is Patient Currently Attending School?: No Last Grade Completed: 12 Name of High School: [employer] Did Garment/textile technologist From [employer]?: Yes Did Theme park manager?: No Did Designer, television/film set?: No Did You Have An Individualized Education Program (IIEP): No Did You Have Any Difficulty At [employer]?: No [name] Education Has Been Impacted by Current Illness: No   CCA Family/Childhood History Family and Relationship History: Family history Marital status: Single Are you sexually active?: Yes What is your sexual orientation?: heterosexual Has your sexual activity been affected by drugs, alcohol, medication, or emotional stress?: NA Does patient have children?: No  Childhood History:  Childhood History By whom was/is the patient raised?: Both parents Additional childhood history information: grew up with parents and sister Description of patient's relationship with caregiver when they were a child: "fine" [name] description of current relationship with people who raised him/her: states they are not supportive How were you disciplined when you got in trouble as a child/adolescent?: verbal Does patient have siblings?: Yes Number of Siblings: 1 Description of patient's current relationship with siblings: sister that lives in [place], no relationship Did patient suffer any verbal/emotional/physical/sexual abuse as a child?: No Did  patient suffer from severe childhood neglect?: No Has patient ever been sexually abused/assaulted/raped as an adolescent or adult?: No Was the patient ever a victim of a crime or a disaster?: No Witnessed domestic violence?: No Has patient been affected by domestic violence as an adult?: No  Child/Adolescent Assessment:     CCA Substance Use Alcohol/Drug Use: Alcohol / Drug Use Pain Medications: see MAR Prescriptions: see MAR Over the  Counter: see MAR History of alcohol / drug use?: Yes Substance #1 Name of Substance 1: THC 1 - Age of First Use: UTA 1 - Amount (size/oz): 1/2 gram 1 - Frequency: daily 1 - Duration: UTA 1 - Last Use / Amount: 5/19 1/2 gram 1 - Method of Aquiring: purchased 1- Route of Use: smoking                       ASAM's:  Six Dimensions of Multidimensional Assessment  Dimension 1:  Acute Intoxication and/or Withdrawal Potential:   Dimension 1:  Description of individual's past and current experiences of substance use and withdrawal: not currently under the influence  Dimension 2:  Biomedical Conditions and Complications:   Dimension 2:  Description of patient's biomedical conditions and  complications: none reported  Dimension 3:  Emotional, Behavioral, or Cognitive Conditions and Complications:  Dimension 3:  Description of emotional, behavioral, or cognitive conditions and complications: bipolar with psychosis  Dimension 4:  Readiness to Change:  Dimension 4:  Description of Readiness to Change criteria: precontemplative  Dimension 5:  Relapse, Continued use, or Continued Problem Potential:  Dimension 5:  Relapse, continued use, or continued problem potential critiera description: does not intend to quit  Dimension 6:  Recovery/Living Environment:  Dimension 6:  Recovery/Iiving environment criteria description: safe, stable environment  ASAM Severity Score: ASAM's Severity Rating Score: 9  ASAM Recommended Level of Treatment: ASAM Recommended Level of Treatment: Level I Outpatient Treatment   Substance use Disorder (SUD) Substance Use Disorder (SUD)  Checklist Symptoms of Substance Use: Evidence of tolerance,Presence of craving or strong urge to use,Social, occupational, recreational activities given up or reduced due to use  Recommendations for Services/Supports/Treatments: Recommendations for Services/Supports/Treatments Recommendations For Services/Supports/Treatments: Individual  Therapy,Medication Management  DSM5 Diagnoses: Patient Active Problem List   Diagnosis Date Noted  . Infected pilonidal cyst 02/05/2014  . HTN (hypertension) 12/03/2011  . 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 Altered mental status 12/02/2011  . Acute psychosis (HCC) 12/02/2011  . Substance abuse (HCC) 12/02/2011  . Leukocytosis 12/02/2011    Patient Centered Plan: Patient is on the following Treatment Plan(s):     Referrals to Alternative Service(s): Referred to Alternative Service(s):   Place:   Date:   Time:    Referred to Alternative Service(s):   Place:   Date:   Time:    Referred to Alternative Service(s):   Place:   Date:   Time:    Referred to Alternative Service(s):   Place:   Date:   Time:     Celedonio Miyamoto, LCSW

## 2021-01-17 NOTE — ED Provider Notes (Signed)
[facility] EMERGENCY DEPARTMENT Provider Note   CSN: [id number] Arrival date & time: 01/17/21  9528     History Chief Complaint  Patient presents with  . Agitation  . Mental Health Problem    Bryce Leon is a 30 y.o. male.  Presented to ER due to agitation.  History limited due to agitation, psychiatric condition.  Patient refuses to elaborate on history.  Per report, he was acting strange, uncooperative, verbally threatening others.  Additional history obtained from chart review, history of ADHD, bipolar disorder.  HPI     Past Medical History:  Diagnosis Date  . ADHD (attention deficit hyperactivity disorder)   . Anxiety   . Arthritis   . Bipolar disorder (HCC)   . Depression     Patient Active Problem List   Diagnosis Date Noted  . Infected pilonidal cyst 02/05/2014  . HTN (hypertension) 12/03/2011  . 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 Altered mental status 12/02/2011  . Acute psychosis (HCC) 12/02/2011  . Substance abuse (HCC) 12/02/2011  . Leukocytosis 12/02/2011    History reviewed. No pertinent surgical history.     History reviewed. No pertinent family history.  Social History   Tobacco Use  . Smoking status: Current Every Day Smoker    Types: Cigarettes    Start date: 05/27/2005  . Smokeless tobacco: Former Neurosurgeon    Types: Snuff, Chew  Substance Use Topics  . Alcohol use: Yes    Alcohol/week: 28.0 standard drinks    Types: 21 Cans of beer, 7 Shots of liquor per week  . Drug use: Yes    Types: Marijuana    Comment: last used about 3 weeks ago    Home Medications Prior to Admission medications   Medication Sig Start Date End Date Taking? Authorizing Provider  divalproex (DEPAKOTE) 500 MG DR tablet Take 2 tablets by mouth twice daily 06/08/16   Patrick North, MD  hydrOXYzine (ATARAX/VISTARIL) 25 MG tablet Take 1 tablet (25 mg total) by mouth 3 (three) times daily as needed.  06/08/16   Patrick North, MD  QUEtiapine (SEROQUEL) 25 MG tablet Take 1 tablet (25 mg total) by mouth 2 (two) times daily. 06/23/16 06/23/17  Patrick North, MD    Allergies    Patient has no known allergies.  Review of Systems   Review of Systems  Unable to perform ROS: Mental status change    Physical Exam Updated Vital Signs BP (!) 134/122   Pulse 81   Temp 97.9 F (36.6 C) (Oral)   Resp 16   Wt 93.2 kg   SpO2 98%   BMI 27.87 kg/m   Physical Exam Vitals and nursing note reviewed.  Constitutional:      Appearance: He is well-developed.  HENT:     Head: Normocephalic and atraumatic.  Eyes:     Conjunctiva/sclera: Conjunctivae normal.  Cardiovascular:     Rate and Rhythm: Normal rate and regular rhythm.     Heart sounds: No murmur heard.   Pulmonary:     Effort: Pulmonary effort is normal. No respiratory distress.     Breath sounds: Normal breath sounds.  Abdominal:     Palpations: Abdomen is soft.     Tenderness: There is no abdominal tenderness.  Musculoskeletal:        General: No deformity or signs of injury.     Cervical back: Neck supple.  Skin:    General: Skin is warm and dry.  Neurological:     General: No focal deficit present.     Mental Status: He is alert and oriented to person, place, and time.  Psychiatric:     Comments: Very agitated, verbally threatening, responding to internal stimuli     ED Results / Procedures / Treatments   Labs (all labs ordered are listed, but only abnormal results are displayed) Labs Reviewed  CBC WITH DIFFERENTIAL/PLATELET - Abnormal; Notable for the following components:      Result Value   MCHC 36.1 (*)    All other components within normal limits  COMPREHENSIVE METABOLIC PANEL - Abnormal; Notable for the following components:   Potassium 3.3 (*)    Glucose, Bld 101 (*)    BUN 5 (*)    ALT 49 (*)    All other components within normal limits  RAPID URINE DRUG SCREEN, HOSP PERFORMED - Abnormal; Notable  for the following components:   Benzodiazepines POSITIVE (*)    Tetrahydrocannabinol POSITIVE (*)    All other components within normal limits  RESP PANEL BY RT-PCR (FLU A&B, COVID) ARPGX2  ETHANOL    EKG None  Radiology No results found.  Procedures Procedures   Medications Ordered in ED Medications  sterile water (preservative free) injection (has no administration in time range)  LORazepam (ATIVAN) injection 2 mg (has no administration in time range)  acetaminophen (TYLENOL) tablet 650 mg (has no administration in time range)  ondansetron (ZOFRAN) tablet 4 mg (has no administration in time range)  zolpidem (AMBIEN) tablet 5 mg (has no administration in time range)  ziprasidone (GEODON) injection 20 mg (20 mg Intramuscular Given 01/17/21 0705)  LORazepam (ATIVAN) injection 1 mg (1 mg Intramuscular Given 01/17/21 0759)    ED Course  I have reviewed the triage vital signs and the nursing notes.  Pertinent labs & imaging results that were available during my care of the patient were reviewed by me and considered in my medical decision making (see chart for details).    MDM Rules/Calculators/A&P                          30 year old gentleman presents to ER by EMS due to concern for agitation.  In ER patient is verbally very aggressive.  He appears to be responding to internal stimuli.  Given concern for severe agitation, threat to self and other, completed IVC paperwork.  Will check basic labs, vitals are stable.  Patient is medically stable for psychiatric evaluation.  Disposition per psych.  Final Clinical Impression(s) / ED Diagnoses Final diagnoses:  Agitation    Rx / DC Orders ED Discharge Orders    None       Milagros Loll, MD 01/17/21 1200

## 2021-01-17 NOTE — ED Notes (Signed)
Pt refusing vitals at this time.

## 2021-01-17 NOTE — ED Notes (Signed)
Ativan 2 mg given IM for anxiety.

## 2021-01-17 NOTE — ED Triage Notes (Signed)
Brought in by [facility] EMS - pt was found hosing himself down in behind a parking lot.   Pt having scattered thoughts. Pt refusing to follow commands.   Pt states "I got picked up off my property, got dragged down here" "if this room is cold, im going to blow this room sky high" "yep, im not joking, im blowing this place up!'   Pt has received 5mg  midazolam IM.   Security at bedside at this time.   Pt states "I smoke marijuana, that's the only thing ive done, I've also done some cocaine in the past"    Pt is not IVCd.

## 2021-01-17 NOTE — ED Provider Notes (Signed)
30 year old male was brought in by EMS for because he was acting strange, and received midazolam by EMS.  He is refusing to go into his room and has been uncooperative with security.  He tells me that he cannot go into that room because he had died in that room before.  He had felt nurses that they cannot pick him up to telemetry because they can read him through the chip.  He clearly has very disordered thinking and needs sedation for his safety and safety of staff.  He is given a dose of ziprasidone.  CRITICAL CARE Performed by: Dione Booze Total critical care time: 35 minutes Critical care time was exclusive of separately billable procedures and treating other patients. Critical care was necessary to treat or prevent imminent or life-threatening deterioration. Critical care was time spent personally by me on the following activities: development of treatment plan with patient and/or surrogate as well as nursing, discussions with consultants, evaluation of patient's response to treatment, examination of patient, obtaining history from patient or surrogate, ordering and performing treatments and interventions, ordering and review of laboratory studies, ordering and review of radiographic studies, pulse oximetry and re-evaluation of patient's condition.   Dione Booze, MD 01/17/21 [phone]

## 2021-01-17 NOTE — ED Notes (Signed)
Pt uncooperative, making verbal threats, security is at bedside. Pt attempting to leave.

## 2021-01-17 NOTE — ED Triage Notes (Signed)
Pt states that " Your not putting that on me, they can hook up to it and read me through the chip. I'm dead, I have died many times. "

## 2021-01-17 NOTE — ED Notes (Signed)
Pt verbalizes understanding that he can only have 2 calls a day 5 minutes

## 2021-01-17 NOTE — ED Notes (Signed)
Tele psych machine to bedside  

## 2021-01-18 DIAGNOSIS — F319 Bipolar disorder, unspecified: Secondary | ICD-10-CM | POA: Diagnosis present

## 2021-01-18 MED ORDER — STERILE WATER FOR INJECTION IJ SOLN
INTRAMUSCULAR | Status: AC
  Administered 2021-01-18: 2 mL via INTRAMUSCULAR
  Filled 2021-01-18: qty 10

## 2021-01-18 MED ORDER — RISPERIDONE 0.5 MG PO TABS
0.5000 mg | ORAL_TABLET | Freq: Two times a day (BID) | ORAL | Status: DC
  Administered 2021-01-18 (×2): 0.5 mg via ORAL
  Filled 2021-01-18 (×2): qty 1

## 2021-01-18 MED ORDER — ZIPRASIDONE MESYLATE 20 MG IM SOLR
10.0000 mg | Freq: Two times a day (BID) | INTRAMUSCULAR | Status: DC | PRN
  Administered 2021-01-18: 10 mg via INTRAMUSCULAR
  Filled 2021-01-18: qty 20

## 2021-01-18 MED ORDER — LORAZEPAM 2 MG/ML IJ SOLN
INTRAMUSCULAR | Status: AC
  Administered 2021-01-18: 2 mg via INTRAMUSCULAR
  Filled 2021-01-18: qty 1

## 2021-01-18 MED ORDER — OLANZAPINE 10 MG IM SOLR
10.0000 mg | Freq: Once | INTRAMUSCULAR | Status: AC
  Administered 2021-01-18: 10 mg via INTRAMUSCULAR
  Filled 2021-01-18: qty 10

## 2021-01-18 MED ORDER — LORAZEPAM 2 MG/ML IJ SOLN
2.0000 mg | Freq: Once | INTRAMUSCULAR | Status: DC
  Filled 2021-01-18: qty 1

## 2021-01-18 MED ORDER — ZIPRASIDONE MESYLATE 20 MG IM SOLR: 20.0000 mg | Freq: Once | INTRAMUSCULAR | Status: AC

## 2021-01-18 MED ORDER — LORAZEPAM 2 MG/ML IJ SOLN
2.0000 mg | Freq: Once | INTRAMUSCULAR | Status: AC
  Administered 2021-01-18: 2 mg via INTRAMUSCULAR

## 2021-01-18 MED ORDER — LORAZEPAM 2 MG/ML IJ SOLN
2.0000 mg | Freq: Once | INTRAMUSCULAR | Status: AC
  Administered 2021-01-18: 2 mg via INTRAMUSCULAR
  Filled 2021-01-18: qty 1

## 2021-01-18 MED ORDER — STERILE WATER FOR INJECTION IJ SOLN
INTRAMUSCULAR | Status: AC
  Filled 2021-01-18: qty 10

## 2021-01-18 MED ORDER — LORAZEPAM 2 MG/ML IJ SOLN: 2.0000 mg | Freq: Once | INTRAMUSCULAR | Status: AC

## 2021-01-18 MED ORDER — DIPHENHYDRAMINE HCL 50 MG/ML IJ SOLN
50.0000 mg | Freq: Two times a day (BID) | INTRAMUSCULAR | Status: DC | PRN
  Administered 2021-01-18: 50 mg via INTRAMUSCULAR
  Filled 2021-01-18: qty 1

## 2021-01-18 MED ORDER — ZIPRASIDONE MESYLATE 20 MG IM SOLR
INTRAMUSCULAR | Status: AC
  Administered 2021-01-18: 20 mg via INTRAMUSCULAR
  Filled 2021-01-18: qty 20

## 2021-01-18 MED ORDER — BENZTROPINE MESYLATE 1 MG PO TABS
0.5000 mg | ORAL_TABLET | Freq: Two times a day (BID) | ORAL | Status: DC
Start: 2021-01-18 — End: 2021-01-18
  Administered 2021-01-18: 0.5 mg via ORAL
  Filled 2021-01-18: qty 1

## 2021-01-18 MED ORDER — LORAZEPAM 2 MG/ML IJ SOLN
1.0000 mg | Freq: Two times a day (BID) | INTRAMUSCULAR | Status: DC | PRN
  Administered 2021-01-18: 1 mg via INTRAMUSCULAR
  Filled 2021-01-18: qty 1

## 2021-01-18 MED ORDER — ZIPRASIDONE MESYLATE 20 MG IM SOLR
20.0000 mg | Freq: Once | INTRAMUSCULAR | Status: AC
  Administered 2021-01-18: 20 mg via INTRAMUSCULAR
  Filled 2021-01-18: qty 20

## 2021-01-18 MED ORDER — KETAMINE HCL 100 MG/ML IJ SOLN
4.0000 mg/kg | Freq: Once | INTRAMUSCULAR | Status: AC
  Administered 2021-01-19: 370 mg via INTRAMUSCULAR
  Filled 2021-01-18: qty 3.7

## 2021-01-18 NOTE — ED Notes (Signed)
Pt out of room yelling at staff. Pt is agitated and verbally threatening staff. Security called. Pt redirected to room and now crying and apologizing  for his behavior. States he "I am anxious and they my paranoia increases"

## 2021-01-18 NOTE — ED Notes (Signed)
Pt again pacing, crying and then verbally aggressive towards staff. Pt out of room and not easily redirected.

## 2021-01-18 NOTE — ED Notes (Signed)
Pt is sleeping

## 2021-01-18 NOTE — ED Notes (Signed)
Pt began acting out, closing and slamming door, cursing and threatening safety attendant. Security called and came to bedside. Dr. Pilar Plate was notified of pt behaviors and came to bedside to assess pt. New orders received.

## 2021-01-18 NOTE — ED Notes (Signed)
Pt still pacing and will not go back into room security remains at bedside

## 2021-01-18 NOTE — ED Notes (Signed)
Pt will not leave monitor in place long enough to obtain accurate vital signs. Pt continues to walk out of his room and yell at staff

## 2021-01-18 NOTE — ED Notes (Signed)
Pt out of room walking around, was able to redirect pt back into room

## 2021-01-18 NOTE — ED Notes (Signed)
Pt put himself back in the floor.

## 2021-01-18 NOTE — ED Notes (Addendum)
Pt continually refusing to stay in bed and presents as a safety risk d/t medication administration. Pt placed in bed several times by this staff and security, charge nurse notified of same. Pt placed in 4 point restraints by ED staff. Pt became volatile after restraint intitiation, rocking stretcher with great force and able to free himself from restraints. Provider at bedside during episode and restraints discontinued by this RN. IM medication administered and sitter at bedside to monitor pt. This RN will continually assess situation for any changes or needs for immediate intervention.

## 2021-01-18 NOTE — ED Notes (Signed)
Pt given a cup of water 

## 2021-01-18 NOTE — ED Notes (Signed)
While attempting to get pts vitals pt started cussing this tech out saying you are the "fucking devil" trying to kill me with this machine. Then security tried to calm down the pt where he continued to threaten security saying I'm going to fucking punch you in the face and shoot you in the shoulder blade". Unable to get a accurate pulse at this time will try again.

## 2021-01-18 NOTE — ED Notes (Signed)
Patient is continuing to harass staff, refusing to stay in his room keeps coming to the door way after being told  multiple times to go in his room and sit down.

## 2021-01-18 NOTE — ED Notes (Signed)
Pt given coffee and a snack

## 2021-01-18 NOTE — Consult Note (Signed)
Telepsych Consultation   Reason for Consult:  Psychosis Referring Physician:  EDP Location of Patient:  [facility] Location of Provider: Other: [place] home office  Patient Identification: Bryce Leon MRN:  [id number] Principal Diagnosis: Bipolar disorder [facility]) Diagnosis:  Principal Problem:   Bipolar disorder (HCC) Active Problems:   Acute psychosis (HCC)   Total Time spent with patient: 30 minutes  Subjective:   Bryce Leon is a 30 y.o. male patient admitted with acute psychosis.  Patient states, "I haven't been feeling good since I stopped my medications."   Bryce Leon, 30 y.o., male  Patient seen via telepsych by this provider; chart reviewed and consulted with Dr. Lucianne Muss on 01/18/21.  On evaluation Bryce Leon reports ongoing thoughts of euphoria, and depression at the same time.  Reports a hx for bipolar disorder and psychosis, used to take depakote, seroquel and hydroxyzine which helped his symptoms but he stopped taking them due to medication side effects, "feeling like a zombie." States he is tired of feeling like this, "I'm really happy and this is not normal." He is crying during the assessment and requests to get started on medications.    HPI:   History    Chief Complaint  Patient presents with  . Agitation  . Mental Health Problem    Bryce Leon is a 30 y.o. male.  Presented to ER due to agitation.  History limited due to agitation, psychiatric condition.  Patient refuses to elaborate on history.  Per report, he was acting strange, uncooperative, verbally threatening others.  Additional history obtained from chart review, history of ADHD, bipolar disorder.   Past Psychiatric History:   Risk to Self:   yes Risk to Others:   yes Prior Inpatient Therapy:   Prior Outpatient Therapy:    Past Medical History:  Past Medical History:  Diagnosis Date  . ADHD (attention deficit hyperactivity disorder)   . Anxiety    . Arthritis   . Bipolar disorder (HCC)   . Depression    History reviewed. No pertinent surgical history. Family History: History reviewed. No pertinent family history. Family Psychiatric  History: unknown Social History:  Social History   Substance and Sexual Activity  Alcohol Use Yes  . Alcohol/week: 28.0 standard drinks  . Types: 21 Cans of beer, 7 Shots of liquor per week     Social History   Substance and Sexual Activity  Drug Use Yes  . Types: Marijuana   Comment: last used about 3 weeks ago    Social History   Socioeconomic History  . Marital status: Single    Spouse name: Not on file  . Number of children: Not on file  . Years of education: Not on file  . Highest education level: Not on file  Occupational History  . Not on file  Tobacco Use  . Smoking status: Current Every Day Smoker    Types: Cigarettes    Start date: 05/27/2005  . Smokeless tobacco: Former Neurosurgeon    Types: Snuff, Chew  Substance and Sexual Activity  . Alcohol use: Yes    Alcohol/week: 28.0 standard drinks    Types: 21 Cans of beer, 7 Shots of liquor per week  . Drug use: Yes    Types: Marijuana    Comment: last used about 3 weeks ago  . Sexual activity: Not Currently  Other Topics Concern  . Not on file  Social History Narrative   ** Merged History Encounter **  Social Determinants of Health   Financial Resource Strain: Not on file  Food Insecurity: Not on file  Transportation Needs: Not on file  Physical Activity: Not on file  Stress: Not on file  Social Connections: Not on file   Additional Social History:    Allergies:  No Known Allergies  Labs:  Results for orders placed or performed during the hospital encounter of 01/17/21 (from the past 48 hour(s))  Rapid urine drug screen (hospital performed)     Status: Abnormal   Collection Time: 01/17/21  8:39 AM  Result Value Ref Range   Opiates NONE DETECTED NONE DETECTED   Cocaine NONE DETECTED NONE DETECTED    Benzodiazepines POSITIVE (A) NONE DETECTED   Amphetamines NONE DETECTED NONE DETECTED   Tetrahydrocannabinol POSITIVE (A) NONE DETECTED   Barbiturates NONE DETECTED NONE DETECTED    Comment: (NOTE) DRUG SCREEN FOR MEDICAL PURPOSES ONLY.  IF CONFIRMATION IS NEEDED FOR ANY PURPOSE, NOTIFY LAB WITHIN 5 DAYS.  LOWEST DETECTABLE LIMITS FOR URINE DRUG SCREEN Drug Class                     Cutoff (ng/mL) Amphetamine and metabolites    1000 Barbiturate and metabolites    200 Benzodiazepine                 200 Tricyclics and metabolites     300 Opiates and metabolites        300 Cocaine and metabolites        300 THC                            50 Performed at [facility], [address], [place], [place] [address]   Resp Panel by RT-PCR (Flu A&B, Covid) Nasopharyngeal Swab     Status: None   Collection Time: 01/17/21  8:41 AM   Specimen: Nasopharyngeal Swab; Nasopharyngeal(NP) swabs in vial transport medium  Result Value Ref Range   SARS Coronavirus 2 by RT PCR NEGATIVE NEGATIVE    Comment: (NOTE) SARS-CoV-2 target nucleic acids are NOT DETECTED.  The SARS-CoV-2 RNA is generally detectable in upper respiratory specimens during the acute phase of infection. The lowest concentration of SARS-CoV-2 viral copies this assay can detect is 138 copies/mL. A negative result does not preclude SARS-Cov-2 infection and should not be used as the sole basis for treatment or other patient management decisions. A negative result may occur with  improper specimen collection/handling, submission of specimen other than nasopharyngeal swab, presence of viral mutation(s) within the areas targeted by this assay, and inadequate number of viral copies(<138 copies/mL). A negative result must be combined with clinical observations, patient history, and epidemiological information. The expected result is Negative.  Fact Sheet for Patients:  BloggerCourse.com  Fact Sheet for  Healthcare Providers:  SeriousBroker.it  This test is no t yet approved or cleared by the [place] FDA and  has been authorized for detection and/or diagnosis of SARS-CoV-2 by FDA under an Emergency Use Authorization (EUA). This EUA will remain  in effect (meaning this test can be used) for the duration of the COVID-19 declaration under Section 564(b)(1) of the Act, 21 U.S.C.section 360bbb-3(b)(1), unless the authorization is terminated  or revoked sooner.       Influenza A by PCR NEGATIVE NEGATIVE   Influenza B by PCR NEGATIVE NEGATIVE    Comment: (NOTE) The Xpert Xpress SARS-CoV-2/FLU/RSV plus assay is intended as an aid in  the diagnosis of influenza from Nasopharyngeal swab specimens and should not be used as a sole basis for treatment. Nasal washings and aspirates are unacceptable for Xpert Xpress SARS-CoV-2/FLU/RSV testing.  Fact Sheet for Patients: BloggerCourse.com  Fact Sheet for Healthcare Providers: SeriousBroker.it  This test is not yet approved or cleared by the [place] FDA and has been authorized for detection and/or diagnosis of SARS-CoV-2 by FDA under an Emergency Use Authorization (EUA). This EUA will remain in effect (meaning this test can be used) for the duration of the COVID-19 declaration under Section 564(b)(1) of the Act, 21 U.S.C. section 360bbb-3(b)(1), unless the authorization is terminated or revoked.  Performed at [facility], [address], [place], [place] [address]   CBC with Differential     Status: Abnormal   Collection Time: 01/17/21  8:45 AM  Result Value Ref Range   WBC 8.5 4.0 - 10.5 K/uL   RBC 5.22 4.22 - 5.81 MIL/uL   Hemoglobin 15.9 13.0 - 17.0 g/dL   HCT 40.3 47.4 - 25.9 %   MCV 84.3 80.0 - 100.0 fL   MCH 30.5 26.0 - 34.0 pg   MCHC 36.1 (H) 30.0 - 36.0 g/dL   RDW 56.3 87.5 - 64.3 %   Platelets 197 150 - 400 K/uL   nRBC 0.0 0.0 - 0.2 %    Neutrophils Relative % 73 %   Neutro Abs 6.2 1.7 - 7.7 K/uL   Lymphocytes Relative 17 %   Lymphs Abs 1.4 0.7 - 4.0 K/uL   Monocytes Relative 9 %   Monocytes Absolute 0.7 0.1 - 1.0 K/uL   Eosinophils Relative 0 %   Eosinophils Absolute 0.0 0.0 - 0.5 K/uL   Basophils Relative 1 %   Basophils Absolute 0.0 0.0 - 0.1 K/uL   Immature Granulocytes 0 %   Abs Immature Granulocytes 0.03 0.00 - 0.07 K/uL    Comment: Performed at [facility], [address] [place], [place] [address]  Comprehensive metabolic panel     Status: Abnormal   Collection Time: 01/17/21  8:45 AM  Result Value Ref Range   Sodium 137 135 - 145 mmol/L   Potassium 3.3 (L) 3.5 - 5.1 mmol/L   Chloride 105 98 - 111 mmol/L   CO2 23 22 - 32 mmol/L   Glucose, Bld 101 (H) 70 - 99 mg/dL    Comment: Glucose reference range applies only to samples taken after fasting for at least 8 hours.   BUN 5 (L) 6 - 20 mg/dL   Creatinine, Ser 8.84 0.61 - 1.24 mg/dL   Calcium 9.1 8.9 - 16.6 mg/dL   Total Protein 7.0 6.5 - 8.1 g/dL   Albumin 4.0 3.5 - 5.0 g/dL   AST 30 15 - 41 U/L   ALT 49 (H) 0 - 44 U/L   Alkaline Phosphatase 64 38 - 126 U/L   Total Bilirubin 0.8 0.3 - 1.2 mg/dL   GFR, Estimated >06 >30 mL/min    Comment: (NOTE) Calculated using the CKD-EPI Creatinine Equation (2021)    Anion gap 9 5 - 15    Comment: Performed at [facility], [address], [place], [place] [address]  Ethanol     Status: None   Collection Time: 01/17/21  8:45 AM  Result Value Ref Range   Alcohol, Ethyl (B) <10 <10 mg/dL    Comment: (NOTE) Lowest detectable limit for serum alcohol is 10 mg/dL.  For medical purposes only. Performed at [facility], [address] [place], [place] [address]     Medications:  Current Facility-Administered Medications  Medication Dose Route Frequency Provider Last Rate Last Admin  . acetaminophen (TYLENOL) tablet 650 mg  650 mg Oral Q4H PRN Milagros Lollykstra, Richard S, MD   650 mg at 01/18/21 1316   . ziprasidone (GEODON) injection 10 mg  10 mg Intramuscular Q12H PRN Ophelia ShoulderMills, Melisha Eggleton E, NP   10 mg at 01/18/21 1457   And  . LORazepam (ATIVAN) injection 1 mg  1 mg Intramuscular Q12H PRN Ophelia ShoulderMills, Qusay Villada E, NP   1 mg at 01/18/21 1458   And  . diphenhydrAMINE (BENADRYL) injection 50 mg  50 mg Intramuscular Q12H PRN Ophelia ShoulderMills, Adaijah Endres E, NP   50 mg at 01/18/21 1458  . nicotine (NICODERM CQ - dosed in mg/24 hours) patch 21 mg  21 mg Transdermal Once Benjiman CorePickering, Nathan, MD   21 mg at 01/17/21 1942  . ondansetron (ZOFRAN) tablet 4 mg  4 mg Oral Q8H PRN Milagros Lollykstra, Richard S, MD      . risperiDONE (RISPERDAL) tablet 0.5 mg  0.5 mg Oral BID Ophelia ShoulderMills, Lula Kolton E, NP   0.5 mg at 01/18/21 1259  . zolpidem (AMBIEN) tablet 5 mg  5 mg Oral QHS PRN Milagros Lollykstra, Richard S, MD       Current Outpatient Medications  Medication Sig Dispense Refill  . divalproex (DEPAKOTE) 500 MG DR tablet Take 2 tablets by mouth twice daily 60 tablet 1  . hydrOXYzine (ATARAX/VISTARIL) 25 MG tablet Take 1 tablet (25 mg total) by mouth 3 (three) times daily as needed. 30 tablet 1  . QUEtiapine (SEROQUEL) 25 MG tablet Take 1 tablet (25 mg total) by mouth 2 (two) times daily. 60 tablet 2    Musculoskeletal:  Limited due to telepsych assessment but patient seen independently ambulating in room, no impairment seen.   Psychiatric Specialty Exam: Physical Exam  Review of Systems  Blood pressure (!) 140/111, pulse (!) 110, temperature 98.7 F (37.1 C), temperature source Oral, resp. rate 18, weight 93.2 kg, SpO2 98 %.Body mass index is 27.87 kg/m.  General Appearance: Bizarre and patient smiling and then crying  Eye Contact:  Good  Speech:  Clear and Coherent and Normal Rate  Volume:  Normal  Mood:  Anxious, Depressed and Euphoric  Affect:  Congruent and Labile  Thought Process:  Coherent and Descriptions of Associations: Intact  Orientation:  Full (Time, Place, and Person)  Thought Content:  Rumination  Suicidal Thoughts:  No  Homicidal  Thoughts:  No  Memory:  Immediate;   Good Recent;   Good Remote;   Good  Judgement:  Impaired  Insight:  Lacking  Psychomotor Activity:  Increased  Concentration:  Concentration: Fair and Attention Span: Fair  Recall:  Good  Fund of Knowledge:  Good  Language:  Good  Akathisia:  NA  Handed:  Right  AIMS (if indicated):     Assets:  Communication Skills Desire for Improvement  ADL's:  Intact  Cognition:  Impaired,  Mild  Sleep:   <4 hours   Treatment Plan Summary: Daily contact with patient to assess and evaluate symptoms and progress in treatment and Medication management   Disposition: Recommend psychiatric Inpatient admission when medically cleared. [facility]; Crissie ReeseJamaral Rease, [facility] SW has been notified and will fax the patient out in search of accepting facilities.   This service was provided via telemedicine using a 2-way, interactive audio and video technology.  Names  of all persons participating in this telemedicine service and their role in this encounter. Name: Bryce Leon Role: Patient  Name: Ophelia Shoulder Role: PMHNP   Chales Abrahams, NP 01/18/2021 3:23 PM

## 2021-01-18 NOTE — ED Notes (Signed)
TTS machine at bedside. 

## 2021-01-18 NOTE — ED Notes (Signed)
Pt used telephone and spoke with his girlfriend

## 2021-01-18 NOTE — ED Notes (Signed)
Pt continues with same behavior loud, yelling and with not stay in his room. Pt redirected and aware of appropriate behavior.

## 2021-01-18 NOTE — ED Notes (Signed)
Pt came out of room yelling and stood in hall. Pt was redirected to room again, pt refused. Pt states "I don't want to go in there, I have been here forever"  Pt continues to yell, "states I don't feel good" Pt laid on floor in hall yelling, and refused to get off the floor. Pt assisted to standing and with staff ambulated back to bed. Vitals obtain

## 2021-01-18 NOTE — ED Notes (Signed)
Pt is yelling, pacing and verbally aggressive with staff. Numerous attempts to redirect. Pt grabbed patient sitters arms and squeeze sitters wrist. Pt would not let go. Security called and panic alarm activated

## 2021-01-18 NOTE — ED Notes (Signed)
Pt continually comes out of room yelling, singing and approaching staff and other patients. Pt redirected numerous times, but remains uncooperative.

## 2021-01-18 NOTE — ED Notes (Signed)
Pt pacing in hallway wont stay in his room, numerous attempts to redirect

## 2021-01-18 NOTE — Progress Notes (Signed)
Per Shnese Mills, NP, patient meets criteria for inpatient treatment. There are no available or appropriate beds at [facility] today. CSW faxed referrals to the following facilities for review:  [facility]  TTS will continue to seek bed placement.  Usama Harkless, MSW, LCSW-A, LCAS-A Phone: [phone] Disposition/TOC  

## 2021-01-18 NOTE — ED Notes (Signed)
Pt continues to be physically aggressive towards staff, charge nurse, security and Dr Particia Nearing at bedside.

## 2021-01-18 NOTE — ED Notes (Signed)
Pt actively roaming around unit, refuses to stay in room. Staff continually redirect pt to room with no avail. Security called to bedside and this nurse notified provider of same. New orders given, will continue to monitor for effectiveness.

## 2021-01-18 NOTE — [facility] Counselor (Signed)
TTS called to re-assessment patient. Was informed to call back due to a distress call in that area.

## 2021-01-18 NOTE — ED Notes (Signed)
Pt out of room pacing in hallway, redirected to room

## 2021-01-18 NOTE — ED Notes (Signed)
Pt got up and vomited all in the floor of the bathroom.

## 2021-01-18 NOTE — ED Notes (Addendum)
Dr Particia Nearing gave verbal ordered at bedside for another 20 mg of geodon and 2 mg of ativan. Dr Particia Nearing aware that pt has already been given a total of 30 mg this shift and that would be a total of 50 mg of geodon.

## 2021-01-18 NOTE — ED Notes (Signed)
Pt out of room again. Pt is requesting PRN meds at this time

## 2021-01-18 NOTE — ED Notes (Signed)
Pt is pacing in room and hallway, difficult to redirect at this time

## 2021-01-18 NOTE — ED Provider Notes (Signed)
  Physical Exam  BP (!) 140/111 (BP Location: Right Arm)   Pulse (!) 110   Temp 98.7 F (37.1 C) (Oral)   Resp 18   Wt 93.2 kg   SpO2 98%   BMI 27.87 kg/m   Physical Exam  ED Course/Procedures     .Critical Care Performed by: Alvira Monday, MD Authorized by: Alvira Monday, MD   Critical care provider statement:    Critical care time (minutes):  30   Critical care was time spent personally by me on the following activities:  Evaluation of patient's response to treatment, examination of patient, ordering and performing treatments and interventions, pulse oximetry, re-evaluation of patient's condition, obtaining history from patient or surrogate and review of old charts    MDM   Received care of patient from previous providers.  Briefly this is a 30 year old male with a history of bipolar who is here with psychosis and aggression.  He has been pacing around in his room, and has not responded to multiple doses of Geodon, Ativan and pulled off restraints.  Concern that he is a danger to himself and staff due to his agitation.   Ordered IM ketamine for agitation, plan for continued cardiac, respiratory, end tidal CO2 monitoring.  Care signed out to Dr. Daun Peacock.      Alvira Monday, MD 01/20/21 0130

## 2021-01-18 NOTE — ED Notes (Signed)
Pt has finally relax and is resting in bed. Respirations are even and unlabored.

## 2021-01-18 NOTE — ED Notes (Signed)
Pt went for short walk around unit with sitter.

## 2021-01-18 NOTE — ED Provider Notes (Signed)
Pt is awaiting inpatient placement.  He has been intermittently very agitated and verbally aggressive towards staff.  Pt given several doses of geodon and ativan.  He will relax briefly, but not for very long.  EKG done.  QTc ok.  Zyprexa IM ordered to try.  Pt briefly put in restraints, but he nearly turned the bed over by rocking it.  So, those were removed.     Jacalyn Lefevre, MD 01/18/21 2039

## 2021-01-18 NOTE — ED Notes (Signed)
Pt escorted to restrroom. Returned to room without difficulty.

## 2021-01-18 NOTE — ED Notes (Signed)
Pt is out of room again, pacing and verbally aggressive towards staff

## 2021-01-19 MED ORDER — QUETIAPINE FUMARATE 50 MG PO TABS: 50.0000 mg | ORAL_TABLET | Freq: Every day | ORAL | Status: DC

## 2021-01-19 MED ORDER — LORAZEPAM 2 MG/ML IJ SOLN
2.0000 mg | Freq: Once | INTRAMUSCULAR | Status: AC
  Administered 2021-01-19: 2 mg via INTRAMUSCULAR
  Filled 2021-01-19: qty 1

## 2021-01-19 MED ORDER — KETAMINE HCL 100 MG/ML IJ SOLN
4.0000 mg/kg | Freq: Once | INTRAMUSCULAR | Status: AC
  Administered 2021-01-19: 370 mg via INTRAMUSCULAR
  Filled 2021-01-19: qty 3.7

## 2021-01-19 MED ORDER — DIPHENHYDRAMINE HCL 50 MG/ML IJ SOLN
25.0000 mg | Freq: Once | INTRAMUSCULAR | Status: AC
  Administered 2021-01-19: 25 mg via INTRAMUSCULAR
  Filled 2021-01-19: qty 1

## 2021-01-19 MED ORDER — OLANZAPINE 5 MG PO TBDP
10.0000 mg | ORAL_TABLET | Freq: Two times a day (BID) | ORAL | Status: DC
  Administered 2021-01-19 – 2021-01-20 (×3): 10 mg via ORAL
  Filled 2021-01-19 (×3): qty 2

## 2021-01-19 MED ORDER — STERILE WATER FOR INJECTION IJ SOLN
INTRAMUSCULAR | Status: AC
  Filled 2021-01-19: qty 10

## 2021-01-19 MED ORDER — DIVALPROEX SODIUM 250 MG PO DR TAB
500.0000 mg | DELAYED_RELEASE_TABLET | Freq: Every day | ORAL | Status: DC
  Administered 2021-01-19 – 2021-01-20 (×2): 500 mg via ORAL
  Filled 2021-01-19 (×2): qty 2

## 2021-01-19 MED ORDER — ZIPRASIDONE MESYLATE 20 MG IM SOLR
20.0000 mg | Freq: Once | INTRAMUSCULAR | Status: AC
  Administered 2021-01-19: 20 mg via INTRAMUSCULAR
  Filled 2021-01-19: qty 20

## 2021-01-19 MED ORDER — LORAZEPAM 1 MG PO TABS
1.0000 mg | ORAL_TABLET | ORAL | Status: DC | PRN
  Administered 2021-01-20 – 2021-01-21 (×5): 1 mg via ORAL
  Filled 2021-01-19 (×5): qty 1

## 2021-01-19 MED ORDER — LORAZEPAM 2 MG/ML IJ SOLN
1.0000 mg | Freq: Once | INTRAMUSCULAR | Status: AC
  Administered 2021-01-19: 1 mg via INTRAMUSCULAR
  Filled 2021-01-19: qty 1

## 2021-01-19 NOTE — Progress Notes (Signed)
CSW was contacted by Asencion Islam, RN from [facility] inquiring about the patient for possible placement. CSW facilitated Marva, RN speaking with the patient's nurse. Marva's advised due to the patient currently being in restraints that at this time there was no appropriate bed available at this time.  Crissie Reese, MSW, LCSW-A, LCAS-A Phone: [phone] Disposition/TOC

## 2021-01-19 NOTE — ED Notes (Signed)
Pt becoming agitated, yelling at staff, demanding medications. Dr. Particia Nearing aware, meds ordered. Pt urinated on himself, ativan administered, sheets changed. Restraints removed, ROM performed, restraints reapplied.

## 2021-01-19 NOTE — ED Notes (Signed)
TTS complete 

## 2021-01-19 NOTE — ED Notes (Signed)
Pt laying in bed, resting, remains awake, singing to self, sitter at bedside, pt refuses to wear monitor, pt agrees to allow pulse ox application when he falls asleep

## 2021-01-19 NOTE — ED Notes (Signed)
Pt administered ketamine IM to left anterior thigh. Placed on cardiac monitor with ETCO2. Currently sleeping with even and unlabored respirations with no acute distress noted. Will continue to assess respiratory status.

## 2021-01-19 NOTE — ED Provider Notes (Signed)
9:55 PM Patient is becoming more agitated. I discussed with pharmacy given he's been difficult to control with medications throughout his stay. He recommends repeat IM Ketamine (received yesterday) as it seemed to help some.    Pricilla Loveless, MD 01/19/21 2213

## 2021-01-19 NOTE — Consult Note (Addendum)
Chart reviewed and consulted with Dr. Lucianne Muss regarding patient's continued agitation and physically aggressive behaviors.  The following medication adjustments were made to promote mood stability:  EKG Qt/QTC 352/413 WNL Start- Zyprexa Zydis 10mg  po BID Continue Depakote 1000mg  po daily  Stop: Seroquel

## 2021-01-19 NOTE — BH Assessment (Signed)
Pt remains in the ED under IVC status due to psychosis and aggression.  Pt's meds were adjusted today by NP.  Pt was reassessed this AM.  He stated that he feels ''much better actually.''' He denied suicidal ideation, homicidal ideation, hallucination, and paranoia.  When asked why he was at the hospital, Pt had less insight -- he stated that he was there because his friends were supposed to keep his keys away from him.  When asked what he wanted to do today, Pt reported that he wanted to go outside and get sunshine.  Based on history and med adjustment today, recommend continued inpatient.

## 2021-01-19 NOTE — ED Notes (Signed)
Pt asleep, upper extremity restraints discontinued, will continue to monitor. Sitter in room with pt.

## 2021-01-19 NOTE — ED Notes (Signed)
Pt agitated, wandering in halls, cussing at staff, pt is still redirectable, pt requesting additional meds for his increased agitation. MD aware

## 2021-01-19 NOTE — ED Notes (Signed)
Pt removed from bilateral leg restraints, sitter at bedside, pt sleeping

## 2021-01-19 NOTE — ED Notes (Addendum)
Pt refusing to stay in exam room, wandering in hall. Pt reminded by staff and security that he cannot walk down the hall and must remain near his room. Pt stated "shut the fuck up" to RN at nurse's station in response to attempts at verbal de-escalation.

## 2021-01-19 NOTE — Progress Notes (Signed)
Per Vivien Presto, patient meets criteria for inpatient treatment. There are no available or appropriate beds at [facility] today. CSW re-faxed referrals to the following facilities for review and additional facility:  [facility]  TTS will continue to seek bed placement.  Crissie Reese, MSW, LCSW-A, LCAS-A Phone: [phone] Disposition/TOC

## 2021-01-19 NOTE — ED Provider Notes (Signed)
  Physical Exam  BP (!) 125/94   Pulse 82   Temp 97.9 F (36.6 C) (Axillary)   Resp 16   Wt 93.2 kg   SpO2 98%   BMI 27.87 kg/m   Physical Exam NCAT Nose normal RRR CTAB NABS Intact distal pulses x 4 ED Course/Procedures    Medications  sterile water (preservative free) injection (has no administration in time range)  acetaminophen (TYLENOL) tablet 650 mg (650 mg Oral Given 01/18/21 1316)  ondansetron (ZOFRAN) tablet 4 mg (has no administration in time range)  zolpidem (AMBIEN) tablet 5 mg (has no administration in time range)  methocarbamol (ROBAXIN) 500 MG tablet (has no administration in time range)  oxyCODONE (Oxy IR/ROXICODONE) 5 MG immediate release tablet (has no administration in time range)  risperiDONE (RISPERDAL) tablet 0.5 mg (0.5 mg Oral Given 01/18/21 2120)  ziprasidone (GEODON) injection 20 mg (20 mg Intramuscular Given 01/17/21 0705)  LORazepam (ATIVAN) injection 1 mg (1 mg Intramuscular Given 01/17/21 0759)  nicotine (NICODERM CQ - dosed in mg/24 hours) patch 21 mg (21 mg Transdermal Patch Applied 01/17/21 1942)  ziprasidone (GEODON) injection 20 mg (20 mg Intramuscular Given 01/18/21 0004)  LORazepam (ATIVAN) injection 2 mg (2 mg Intramuscular Given 01/18/21 0138)  ziprasidone (GEODON) injection 20 mg (20 mg Intramuscular Given 01/18/21 0753)  sterile water (preservative free) injection (2 mLs Intramuscular Given 01/18/21 0756)  ziprasidone (GEODON) injection 20 mg (20 mg Intramuscular Given 01/18/21 1619)  LORazepam (ATIVAN) injection 2 mg (2 mg Intramuscular Given 01/18/21 1618)  LORazepam (ATIVAN) injection 2 mg (2 mg Intramuscular Given 01/18/21 1947)  OLANZapine (ZYPREXA) injection 10 mg (10 mg Intramuscular Given 01/18/21 2044)  sterile water (preservative free) injection (  Given 01/18/21 2045)  ketamine (KETALAR) injection 370 mg (370 mg Intramuscular Given 01/19/21 0002)  ziprasidone (GEODON) injection 20 mg (20 mg Intramuscular Given 01/19/21 0228)  sterile water  (preservative free) injection (  Given 01/19/21 0227)  diphenhydrAMINE (BENADRYL) injection 25 mg (25 mg Intramuscular Given 01/19/21 0315)    Procedures  MDM  Patient is agitated and combative and ketamine from prior shift wore off  Given Geodon IM with improvement but not calm, Benadryl Im given, restraints renewed.    Now sleeping in the room comfortably with normal vitals        Jabree Rebert, MD 01/19/21 [phone]

## 2021-01-20 MED ORDER — HALOPERIDOL LACTATE 5 MG/ML IJ SOLN
5.0000 mg | Freq: Four times a day (QID) | INTRAMUSCULAR | Status: DC | PRN
  Administered 2021-01-20 – 2021-01-21 (×4): 5 mg via INTRAMUSCULAR
  Filled 2021-01-20 (×4): qty 1

## 2021-01-20 MED ORDER — LORAZEPAM 2 MG/ML IJ SOLN
2.0000 mg | Freq: Once | INTRAMUSCULAR | Status: AC
  Administered 2021-01-20: 2 mg via INTRAMUSCULAR
  Filled 2021-01-20: qty 1

## 2021-01-20 MED ORDER — DIPHENHYDRAMINE HCL 25 MG PO CAPS
50.0000 mg | ORAL_CAPSULE | Freq: Three times a day (TID) | ORAL | Status: DC | PRN
  Filled 2021-01-20: qty 2

## 2021-01-20 MED ORDER — OLANZAPINE 5 MG PO TBDP
10.0000 mg | ORAL_TABLET | Freq: Three times a day (TID) | ORAL | Status: DC
  Administered 2021-01-20 – 2021-01-22 (×4): 10 mg via ORAL
  Filled 2021-01-20 (×6): qty 2

## 2021-01-20 MED ORDER — HALOPERIDOL 5 MG PO TABS
5.0000 mg | ORAL_TABLET | Freq: Four times a day (QID) | ORAL | Status: DC | PRN
  Administered 2021-01-21: 5 mg via ORAL
  Filled 2021-01-20 (×2): qty 1

## 2021-01-20 MED ORDER — HYDROXYZINE HCL 50 MG PO TABS: 50.0000 mg | ORAL_TABLET | Freq: Three times a day (TID) | ORAL | Status: DC | PRN

## 2021-01-20 MED ORDER — DIPHENHYDRAMINE HCL 50 MG/ML IJ SOLN
50.0000 mg | Freq: Three times a day (TID) | INTRAMUSCULAR | Status: DC | PRN
  Administered 2021-01-20 – 2021-01-21 (×4): 50 mg via INTRAMUSCULAR
  Filled 2021-01-20 (×4): qty 1

## 2021-01-20 MED ORDER — DIPHENHYDRAMINE HCL 50 MG/ML IJ SOLN
50.0000 mg | Freq: Once | INTRAMUSCULAR | Status: AC
  Administered 2021-01-20: 50 mg via INTRAMUSCULAR
  Filled 2021-01-20: qty 1

## 2021-01-20 MED ORDER — DIVALPROEX SODIUM 250 MG PO DR TAB
500.0000 mg | DELAYED_RELEASE_TABLET | Freq: Three times a day (TID) | ORAL | Status: DC
  Administered 2021-01-20 – 2021-01-22 (×5): 500 mg via ORAL
  Filled 2021-01-20 (×6): qty 2

## 2021-01-20 MED ORDER — NICOTINE 14 MG/24HR TD PT24
14.0000 mg | MEDICATED_PATCH | Freq: Once | TRANSDERMAL | Status: AC
  Administered 2021-01-20: 14 mg via TRANSDERMAL
  Filled 2021-01-20: qty 1

## 2021-01-20 MED ORDER — BENZTROPINE MESYLATE 1 MG PO TABS: 1.0000 mg | ORAL_TABLET | Freq: Two times a day (BID) | ORAL | Status: DC | PRN

## 2021-01-20 NOTE — ED Notes (Signed)
Pt sleeping. 

## 2021-01-20 NOTE — ED Notes (Signed)
Unable to draw blood due to Aggressive behavior

## 2021-01-20 NOTE — ED Notes (Signed)
Pt woke up from nap, pt now sitting up and eating his lunch. Will continue to monitor pt.

## 2021-01-20 NOTE — Consult Note (Addendum)
Consulted due to Bipolar Disorder and Severe Agitation   I personally spent 20 minutes on the unit in direct patient care. The direct patient care time included face-to-face time with the patient, reviewing the patient's chart, communicating with other professionals, and coordinating care. Greater than 50% of this time was spent in counseling or coordinating care with the patient regarding goals of hospitalization, psycho-education, and discharge planning needs.    HPI: Patient was asleep due to agitation medications given earlier. Nurse reports that patient was very agitated/aggressive and intrusive to other patients in the ED.   Per chart review Bryce Burly RN - "pt was found hosing himself down in behind a parking lot. Pt having scattered thoughts. Pt refusing to follow commands. Pt states "I got picked up off my property, got dragged down here" "if this room is cold, im going to blow this room sky high" "yep, im not joking, im blowing this place up!'"  Bryce Booze MD- "He is refusing to go into his room and has been uncooperative with security.  He tells me that he cannot go into that room because he had died in that room before.  He had felt nurses that they cannot pick him up to telemetry because they can read him through the chip.  He clearly has very disordered thinking and needs sedation for his safety and safety of staff. "   He has continued to be disorganized, aggressive, and intrusive to staff and other patients despite multiple attempts each time at redirection. He requires multiple PRN medications and safety sitters. He is more acute than our facility can care for and he requires [facility]. Social Work has already sent his information and [facility] should be contacted daily for possible placement.   PPHx: Bipolar, Multiple hospitalizations.   Plan: Case discussed with Bryce Leon Given his longstanding sever Bipolar he requires a high level of care that cannot be given at this facility, he  needs placement at [facility]. Social Work should contact [facility] daily to inquire about placement. Will increase his Zyprexa and Depakote. Will start PRN Agitation medications. Do not given Haldol PRN within 30 minutes of administering Zyprexa. Will continue to monitor patient. Will obtain lab work tomorrow AM.   -Continue 1 to 1 Safety Sitter -Increase Zyprexa to 10 mg TID -Increase Depakote to 500 mg TID -Start Haldol 5 mg PO or IM q6 PRN for agitation Do not given Haldol PRN within 30 minutes of administering Zyprexa. -Start Benadryl 50 mg PO or IM q8 PRN agitation -Start Cogentin 1 mg BID PRN tremors -Continue Ativan 1 mg PO q4 PRN agitation -Obtain Valproic Acid, Lipid Panel, A1C, CMP, and CBC tomorrow AM    Bryce Snipe MD Resident

## 2021-01-20 NOTE — ED Notes (Signed)
Pt mattress moved to hall on floor because Pt refused to stop pulling the Code Blue leaver in room.

## 2021-01-20 NOTE — ED Notes (Signed)
Pt  Standing at staff desk eating lunch . Pt refuses to sit in chair to eat.

## 2021-01-20 NOTE — ED Notes (Signed)
Pt has been using aggressive profanity, using certain names on security guards and attempting to elope. Pt became extremely agressive after using his second and final phone call.  Pt slammed the phone down and attempted to leave. 2 security guards brought pt back to his chair out on the hallway. Pt began yelling out loudly to leave him be, security guard attempted to calm pt down but began using the n-word on certain staff members. Pt is now on his mattress after a couple of minutes of acting out. Will continue to monitor pt. Security at bedside too.

## 2021-01-20 NOTE — ED Notes (Signed)
Pt made a phone call to his girlfriend. Pt aware that he only gets 2 phone calls, 5 minutes each. Will continue to monitor pt

## 2021-01-20 NOTE — ED Notes (Signed)
This patient has been acting out. This Clinical research associate asked RN's to call for security, but no phone/walkie was charged. This writer pressed the duress button. {Pt took out the table and slammed it against the door to pt room 48. Walked out of their room and then walked all the way at the end of the hallway. Pt did 2 laps, despite this writer attempted to several times of to get him back into the room. However, pt displayed clear intent of committing harm to this Clinical research associate by using profanity ("fu*ck you, B*tch. Get the f*ck out of my way", "Man, f*ck this place" and "please leave me ALONE".). and displaying anger by facial expressions. 5 security guards are now present in front of pt's room. This writer is not at arms length, but a little further for security reasons. This Clinical research associate can see pt and will continue to monitor pt

## 2021-01-20 NOTE — ED Notes (Signed)
Pt went to the bathroom and took a cup in the bathroom. The patient then got water, to drink, from the toilet. Pt is now not allowed to have any cups or anything. Pt was told about this and then pulled the code blue alarm from his room. Pt is now on his mattress, outside on the hallway. 2 Security guards are at standby. Will continue to monitor pt.

## 2021-01-20 NOTE — ED Notes (Signed)
Pt attempted to go back into room despite loosing that privilege from pressing the blue code button. Security guard was able to walk pt back to his mattress. Will continue to monitor pt

## 2021-01-20 NOTE — ED Notes (Signed)
Pt's lunch has arrived. Pt is still asleep, will have his lunch tray at nurses station. Will continue to monitor pt.

## 2021-01-20 NOTE — ED Notes (Addendum)
Pt used the phone. While on the phone pt started yelling and then slammed the phone down and started cussing. Pt upset and asking why does his girlfriend hate him. Tried to exit the unit with security and sitter following him. Security able to redirect him back towards his room. Pt became upset again and threw fist up at Engineer, materials . Pt then turned around and began slamming bathroom door and then he ripped the door off the hinges.

## 2021-01-20 NOTE — ED Notes (Signed)
Pt at room door with aggressive  Behavior. Pt making loud obscene verbal noises at Male staff member who was walking by Pt room. Pt reported he was feeling weird again. Prn given.

## 2021-01-20 NOTE — ED Notes (Signed)
Pt asked to take a shower, pt given supplies. Pt now in shower, pt aware of shower rules. Will continue to monitor pt. Security still on stand by

## 2021-01-20 NOTE — ED Notes (Signed)
Per request of Martie Lee This writer attempted to contact [facility] assessment  Team. The MD Trinna Post is not available to leave a message about Pt tearing the bath room door off this afternoon.. This Clinical research associate called [facility] and was transferred to assessment team but there

## 2021-01-20 NOTE — ED Notes (Signed)
Pt was awoken due to blood drawn and patient woke up then had an incontinent episode. Nurse tech on floor and this Clinical research associate changed pt into new scrubs. Pt now on his bed. Will continue to monitor pt.

## 2021-01-20 NOTE — ED Notes (Signed)
Pt got up and noticed a patient from another room acting out. This patient fed off of that energy and began to cry, and yell at another sitter. This writer walked into the patients room and helped redirect the patient back into the room. The patient then verbally stated "I feel weird, I need help sleeping. I feel like.. no actually, I want something because I feel tired but I can't sleep. Can you ask to give me something to help?". This Environmental manager that patient was feeling anxious and beginning to becoming agitated again. Will continue to monitor pt

## 2021-01-20 NOTE — ED Notes (Signed)
Pt physically hostile  And aggressive to staff . Pt demanding his hat back.

## 2021-01-20 NOTE — ED Provider Notes (Signed)
Emergency Medicine Observation Re-evaluation Note  Bryce Leon is a 30 y.o. male, seen on rounds today.  Pt initially presented to the ED for complaints of Agitation and Mental Health Problem Currently, the patient is standing in the doorway.  Physical Exam  BP (!) 146/97 (BP Location: Left Arm)   Pulse 80   Temp 97.8 F (36.6 C)   Resp 16   Wt 93.2 kg   SpO2 97%   BMI 27.87 kg/m  Physical Exam General: standing comfortably, NAD Lungs: normal WOB Psych: currently calm and resting  ED Course / MDM  EKG:EKG Interpretation  Date/Time:  Saturday Jan 18 2021 20:02:23 EDT Ventricular Rate:  87 PR Interval:  164 QRS Duration: 86 QT Interval:  362 QTC Calculation: 435 R Axis:   41 Text Interpretation: Normal sinus rhythm Normal ECG Confirmed by Jacalyn Lefevre [phone]) on 01/18/2021 8:35:32 PM   I have reviewed the labs performed to date as well as medications administered while in observation.  Recent changes in the last 24 hours include requiring IM medication for agitation.  Plan  Current plan is for placement, social work is involved.   Rozelle Logan, DO 01/20/21 1027

## 2021-01-20 NOTE — ED Notes (Signed)
I sent a secure chat thur Shuvon Rankin and received a response from DR A. Pashayan.

## 2021-01-20 NOTE — Progress Notes (Signed)
Patient was verbally reviewed to [facility] to be placed on priority list for placement. Supporting documentation was sent via fax.   Damita Dunnings, MSW, LCSW-A  2:17 PM 01/20/2021

## 2021-01-20 NOTE — Progress Notes (Signed)
Pt has been denied to [facility], which is why he has been referred out. Patient meets inpatient criteria per Inetta Fermo Allen,NP. Patient referred to the following facilities:  [facility]  [address] [place] [place] [address] [phone]  [facility]  [address] [place] [address] [phone]  [facility]  [address] [place] [address] [phone]  [facility]  [address], [place] [address] [phone]  [facility]  [address] [place], [place] [address] [phone]  [facility]  [address] [place] [address] [phone]  [facility]  [address] [place] [address] [phone]  [facility]  [address] [place] [address] [phone]  [facility]  [address] [place] [address] [phone]  [facility]  [address] [place] [address] [phone]  [facility]  [address] [place] [address] [phone]  [facility]  [address], [place] [address] [phone]  [facility]  [address] [place] [address] [phone]  [facility]  [address] [place] [address] [phone]  [facility]  [address] [place] [address] [phone]    CSW will continue to monitor disposition.   Damita Dunnings, MSW, LCSW-A  11:32 AM 01/20/2021

## 2021-01-20 NOTE — ED Notes (Signed)
Pt is stating "I will kill myself by cutting myself by using that can. Or someone can rip it open and kill me with it. God, I f*cking hope they do". Pt continues to slam their hand against their own hand, yell loudly at security and press the code button. Security at bedside and RN giving pt medication now. Will continue to monitor pt form far away.

## 2021-01-20 NOTE — ED Notes (Signed)
PT aggressive ,hostile slamming doors and attempting to leave .

## 2021-01-20 NOTE — ED Notes (Signed)
Pt expressed boredom and asked for something to do. Savannah, the nurse tech on the floor, provided patient with 2 word puzzle books and a box of dull markers. Will continue to monitor pt.

## 2021-01-20 NOTE — BH Assessment (Signed)
Disposition: Per Dr. Lucianne Muss, continue inpatient treatment and [facility] placement (priority wait list), due to aggressive/combative behavior in ED. Per CSW, supporting documentation was sent via fax. @ 1418. Vonna Kotyk @ 2025, confirmed patient's status on the wait list.

## 2021-01-20 NOTE — ED Notes (Signed)
Patient became agitated while administering meds and begin pulling the code button 2-3 times back to back; and Security called for support;  Patient given PRN med and then took PO meds that was scheduled; Pt took IM without being held down-Monique,RN

## 2021-01-21 LAB — HEMOGLOBIN A1C
Hgb A1c MFr Bld: 4.8 % (ref 4.8–5.6)
Mean Plasma Glucose: 91 mg/dL

## 2021-01-21 LAB — CBC
HCT: 43.3 % (ref 39.0–52.0)
Hemoglobin: 15.3 g/dL (ref 13.0–17.0)
MCH: 30.5 pg (ref 26.0–34.0)
MCHC: 35.3 g/dL (ref 30.0–36.0)
MCV: 86.3 fL (ref 80.0–100.0)
Platelets: 175 10*3/uL (ref 150–400)
RBC: 5.02 MIL/uL (ref 4.22–5.81)
RDW: 12.1 % (ref 11.5–15.5)
WBC: 5.9 10*3/uL (ref 4.0–10.5)
nRBC: 0 % (ref 0.0–0.2)

## 2021-01-21 LAB — LIPID PANEL
Cholesterol: 100 mg/dL (ref 0–200)
HDL: 30 mg/dL — ABNORMAL LOW (ref >40)
LDL Cholesterol: 52 mg/dL (ref 0–99)
Total CHOL/HDL Ratio: 3.3 RATIO
Triglycerides: 91 mg/dL (ref <150)
VLDL: 18 mg/dL (ref 0–40)

## 2021-01-21 LAB — COMPREHENSIVE METABOLIC PANEL
ALT: 61 U/L — ABNORMAL HIGH (ref 0–44)
AST: 51 U/L — ABNORMAL HIGH (ref 15–41)
Albumin: 3.3 g/dL — ABNORMAL LOW (ref 3.5–5.0)
Alkaline Phosphatase: 60 U/L (ref 38–126)
Anion gap: 6 (ref 5–15)
BUN: 5 mg/dL — ABNORMAL LOW (ref 6–20)
CO2: 30 mmol/L (ref 22–32)
Calcium: 8.9 mg/dL (ref 8.9–10.3)
Chloride: 102 mmol/L (ref 98–111)
Creatinine, Ser: 0.84 mg/dL (ref 0.61–1.24)
GFR, Estimated: >60 mL/min (ref >60)
Glucose, Bld: 73 mg/dL (ref 70–99)
Potassium: 4.4 mmol/L (ref 3.5–5.1)
Sodium: 138 mmol/L (ref 135–145)
Total Bilirubin: 1 mg/dL (ref 0.3–1.2)
Total Protein: 6.2 g/dL — ABNORMAL LOW (ref 6.5–8.1)

## 2021-01-21 LAB — VALPROIC ACID LEVEL: Valproic Acid Lvl: 64 ug/mL (ref 50.0–100.0)

## 2021-01-21 NOTE — ED Notes (Signed)
Pt pulled code blue button. Pt educated on not using Code blue button

## 2021-01-21 NOTE — ED Notes (Signed)
Pt resting comfortably in bed eating breakfast and calm at this time

## 2021-01-21 NOTE — ED Notes (Signed)
After pt got off the phone the 2nd time pt was upset. Pt started walking towards other side of purple with [employer] officer following pt. Pt walked out to the lobby and was brought back to purple.

## 2021-01-21 NOTE — ED Notes (Signed)
Pt pulled code blue button again. Pt redirected to his mattress by the nurses station and educated again about not pulling the code blue button.

## 2021-01-21 NOTE — ED Notes (Signed)
Pt pulled the cold blue. Pt and pt bed is now out at nurse desk.

## 2021-01-21 NOTE — ED Notes (Signed)
Girlfriend Gearldine Bienenstock [phone] wants an update

## 2021-01-21 NOTE — ED Notes (Addendum)
Steinl MD responded to pt pulling code blue. Pharmacy and this RN talked to MD about more prn options for pt d/t his escalating behavior.

## 2021-01-21 NOTE — Progress Notes (Signed)
Patient referred out again at this time, no leads at this time. See original note for full list of facilities. Patient remains on [facility] priority waitlist due to aggression in ED.   Signed:  Corky Crafts, MSW, [place], LCASA 01/21/2021 11:06 AM

## 2021-01-21 NOTE — ED Notes (Addendum)
Pt agitated and arguing. Pt yelling and getting up in the face of one of the sitters. [employer] tried to deescalate situation. Pt non redirectable. Called security to help with medication administration.

## 2021-01-21 NOTE — ED Notes (Signed)
Pt tearful and angry after his 1st phone call. Pt redirected by security and [employer] to mattress. Pt becoming verbally aggressive with security and up in security's face and being threatening.

## 2021-01-21 NOTE — ED Notes (Signed)
Zyprexa is not in med room. RN requested it from pharmacy

## 2021-01-21 NOTE — ED Notes (Signed)
Pt calmly resting on mattress by nurses station at this time. Decreased loud noises and stimulation at this time to assist in pt calming down.

## 2021-01-21 NOTE — ED Notes (Signed)
Pt calm and cooperative at this time requesting his 2nd phone call. Instructed pt that he can have his 2nd phone call for today.

## 2021-01-21 NOTE — ED Provider Notes (Signed)
Emergency Medicine Observation Re-evaluation Note  Bryce Leon is a 30 y.o. male, seen on rounds today.  Pt initially presented to the ED for complaints of Agitation and Mental Health Problem  Currently, patient is standing at his doorway does not appear in acute distress he has no complaints at this time.  Vital signs remained stable, lab work is unremarkable, EKG from the 21st reviewed unremarkable  Physical Exam  BP (!) 135/97 (BP Location: Left Leg)   Pulse 64   Temp (!) 97.5 F (36.4 C) (Oral)   Resp 20   Wt 93.2 kg   SpO2 100%   BMI 27.87 kg/m  Physical Exam General: Disheveled but does not appear in acute distress Cardiac: Heart rate within normal limits Lungs: No respiratory distress Psych: appropriate for his current condition  ED Course / MDM  EKG:EKG Interpretation  Date/Time:  Saturday Jan 18 2021 20:02:23 EDT Ventricular Rate:  87 PR Interval:  164 QRS Duration: 86 QT Interval:  362 QTC Calculation: 435 R Axis:   41 Text Interpretation: Normal sinus rhythm Normal ECG Confirmed by Jacalyn Lefevre [phone]) on 01/18/2021 8:35:32 PM   Plan  Current plan is for Dr. Lucianne Muss.Recommends continued inpatient treatment for patient be sent to [facility] placement who recommends placement to [facility] as there appears to be availability there.  Patient is under full IVC at this time.   Carroll Sage, PA-C 01/21/21 8338    Cathren Laine, MD 01/23/21 [phone]

## 2021-01-21 NOTE — ED Notes (Signed)
Pt being uncooperative w/ staff and security at this time. Pt redirected at this time and laying on mattress at this time.

## 2021-01-22 DIAGNOSIS — F312 Bipolar disorder, current episode manic severe with psychotic features: Secondary | ICD-10-CM

## 2021-01-22 NOTE — ED Notes (Signed)
Pt gave consent for TTS to call and consult his mother, psychiatry was informed.

## 2021-01-22 NOTE — ED Provider Notes (Signed)
I was contacted by behavioral health, patient has been psychiatric cleared.  Recommendation is outpatient follow-up at [facility].  IVC has been rescinded, paperwork has been faxed.  AVS has been completed and printed.  Patient appears stable at time of discharge.   Rozelle Logan, DO 01/22/21 1056

## 2021-01-22 NOTE — ED Notes (Signed)
Pt using his first phone call today.

## 2021-01-22 NOTE — ED Notes (Signed)
This RN was informed that pt has been psych cleared, IVC rescend paperwork has been given to Diplomatic Services operational officer to be faxed to [employer].

## 2021-01-22 NOTE — Discharge Instructions (Addendum)
You have been seen in the emergency department and evaluated by the behavioral health team.  You are being discharged for outpatient psychiatric follow-up at [facility], please see attached information.  If you have any worsening symptoms, thoughts of hurting yourself, thoughts of hurting others, concerns for your health please call 911 or return immediately to an emergency department.  Take your home medications as prescribed.     If you have any thoughts of hurting yourself or others please utilize the resources listed in this packet or return immediately to emergency department if you have suicidal or homicidal thoughts.  We are here to help.  You may call the suicide prevention Hotline at [phone].

## 2021-01-22 NOTE — ED Notes (Signed)
Pt taking a shower 

## 2021-01-22 NOTE — ED Notes (Signed)
Pt used his 2nd/last phone call.

## 2021-01-22 NOTE — ED Provider Notes (Signed)
Emergency Medicine Observation Re-evaluation Note  Bryce Leon is a 30 y.o. male, seen on rounds today.  Pt initially presented to the ED for complaints of Agitation and Mental Health Problem Currently, the patient is laying in bed, calm and cooperative.  Physical Exam  BP (!) 146/94 (BP Location: Right Arm) Comment: RN notified  Pulse 81   Temp 97.9 F (36.6 C) (Oral)   Resp 18   Wt 93.2 kg   SpO2 99%   BMI 27.87 kg/m  Physical Exam General: resting comfortably, NAD Lungs: normal WOB Psych: currently calm and resting  ED Course / MDM  EKG:EKG Interpretation  Date/Time:  Saturday Jan 18 2021 20:02:23 EDT Ventricular Rate:  87 PR Interval:  164 QRS Duration: 86 QT Interval:  362 QTC Calculation: 435 R Axis:   41 Text Interpretation: Normal sinus rhythm Normal ECG Confirmed by Jacalyn Lefevre [phone]) on 01/18/2021 8:35:32 PM   I have reviewed the labs performed to date as well as medications administered while in observation.  Recent changes in the last 24 hours include none.  Plan  Current plan is for inpatient treatment and placement, he is pending bed availability. Patient is under full IVC at this time.   Rozelle Logan, [place] 01/22/21 0254

## 2021-01-22 NOTE — ED Notes (Signed)
Pt's vital signs updated. Pt is psych cleared, IVC rescinded. Pt's belongings returned. Discharge paperwork given and discussed. Pt was also given a bus pass.
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
## 2021-01-22 NOTE — ED Notes (Signed)
TTS in use at this time. 

## 2021-01-22 NOTE — ED Notes (Signed)
Gearldine Bienenstock girlfriend requesting an update [phone]

## 2021-01-27 ENCOUNTER — Emergency Department (HOSPITAL_COMMUNITY)
Admission: EM | Admit: 2021-01-27 | Discharge: 2021-01-28 | Disposition: A | Discharge status: Home / Self Care | Payer: Self-pay | Attending: Emergency Medicine | Admitting: Emergency Medicine

## 2021-01-27 ENCOUNTER — Emergency Department (HOSPITAL_COMMUNITY)
Admission: EM | Admit: 2021-01-27 | Discharge: 2021-01-27 | Disposition: A | Discharge status: Home / Self Care | Payer: Self-pay

## 2021-01-27 ENCOUNTER — Other Ambulatory Visit: Payer: Self-pay

## 2021-01-27 ENCOUNTER — Encounter (HOSPITAL_COMMUNITY): Payer: Self-pay

## 2021-01-27 DIAGNOSIS — Z20822 Contact with and (suspected) exposure to covid-19: Secondary | ICD-10-CM | POA: Insufficient documentation

## 2021-01-27 DIAGNOSIS — F909 Attention-deficit hyperactivity disorder, unspecified type: Secondary | ICD-10-CM | POA: Insufficient documentation

## 2021-01-27 DIAGNOSIS — R451 Restlessness and agitation: Secondary | ICD-10-CM

## 2021-01-27 DIAGNOSIS — Z046 Encounter for general psychiatric examination, requested by authority: Secondary | ICD-10-CM | POA: Insufficient documentation

## 2021-01-27 DIAGNOSIS — F17210 Nicotine dependence, cigarettes, uncomplicated: Secondary | ICD-10-CM | POA: Insufficient documentation

## 2021-01-27 DIAGNOSIS — I10 Essential (primary) hypertension: Secondary | ICD-10-CM | POA: Insufficient documentation

## 2021-01-27 LAB — CBC WITH DIFFERENTIAL/PLATELET
Abs Immature Granulocytes: 0.04 10*3/uL (ref 0.00–0.07)
Basophils Absolute: 0.1 10*3/uL (ref 0.0–0.1)
Basophils Relative: 1 %
Eosinophils Absolute: 0.1 10*3/uL (ref 0.0–0.5)
Eosinophils Relative: 1 %
HCT: 41.6 % (ref 39.0–52.0)
Hemoglobin: 15 g/dL (ref 13.0–17.0)
Immature Granulocytes: 0 %
Lymphocytes Relative: 21 %
Lymphs Abs: 2.3 10*3/uL (ref 0.7–4.0)
MCH: 30.6 pg (ref 26.0–34.0)
MCHC: 36.1 g/dL — ABNORMAL HIGH (ref 30.0–36.0)
MCV: 84.9 fL (ref 80.0–100.0)
Monocytes Absolute: 1.2 10*3/uL — ABNORMAL HIGH (ref 0.1–1.0)
Monocytes Relative: 11 %
Neutro Abs: 7.4 10*3/uL (ref 1.7–7.7)
Neutrophils Relative %: 66 %
Platelets: 218 10*3/uL (ref 150–400)
RBC: 4.9 MIL/uL (ref 4.22–5.81)
RDW: 12.5 % (ref 11.5–15.5)
WBC: 11.2 10*3/uL — ABNORMAL HIGH (ref 4.0–10.5)
nRBC: 0 % (ref 0.0–0.2)

## 2021-01-27 LAB — RAPID URINE DRUG SCREEN, HOSP PERFORMED
Amphetamines: NOT DETECTED
Barbiturates: NOT DETECTED
Benzodiazepines: NOT DETECTED
Cocaine: NOT DETECTED
Opiates: NOT DETECTED
Tetrahydrocannabinol: POSITIVE — AB

## 2021-01-27 LAB — RESP PANEL BY RT-PCR (FLU A&B, COVID) ARPGX2
Influenza A by PCR: NEGATIVE
Influenza B by PCR: NEGATIVE
SARS Coronavirus 2 by RT PCR: NEGATIVE

## 2021-01-27 LAB — COMPREHENSIVE METABOLIC PANEL
ALT: 71 U/L — ABNORMAL HIGH (ref 0–44)
AST: 48 U/L — ABNORMAL HIGH (ref 15–41)
Albumin: 4.4 g/dL (ref 3.5–5.0)
Alkaline Phosphatase: 71 U/L (ref 38–126)
Anion gap: 7 (ref 5–15)
BUN: 16 mg/dL (ref 6–20)
CO2: 25 mmol/L (ref 22–32)
Calcium: 9.2 mg/dL (ref 8.9–10.3)
Chloride: 106 mmol/L (ref 98–111)
Creatinine, Ser: 0.92 mg/dL (ref 0.61–1.24)
GFR, Estimated: >60 mL/min (ref >60)
Glucose, Bld: 99 mg/dL (ref 70–99)
Potassium: 3.7 mmol/L (ref 3.5–5.1)
Sodium: 138 mmol/L (ref 135–145)
Total Bilirubin: 0.6 mg/dL (ref 0.3–1.2)
Total Protein: 7.3 g/dL (ref 6.5–8.1)

## 2021-01-27 LAB — ETHANOL: Alcohol, Ethyl (B): <10 mg/dL (ref <10)

## 2021-01-27 MED ORDER — ZIPRASIDONE MESYLATE 20 MG IM SOLR: 20.0000 mg | Freq: Once | INTRAMUSCULAR | Status: DC | PRN

## 2021-01-27 MED ORDER — LORAZEPAM 1 MG PO TABS: 1.0000 mg | ORAL_TABLET | Freq: Once | ORAL | Status: DC

## 2021-01-27 MED ORDER — NAPROXEN 500 MG PO TABS
500.0000 mg | ORAL_TABLET | Freq: Once | ORAL | Status: AC
  Administered 2021-01-27: 500 mg via ORAL
  Filled 2021-01-27: qty 1

## 2021-01-27 MED ORDER — KETAMINE HCL 50 MG/ML IJ SOLN
4.0000 mg/kg | Freq: Once | INTRAMUSCULAR | Status: AC
  Administered 2021-01-28: 375 mg via INTRAMUSCULAR
  Filled 2021-01-27: qty 10

## 2021-01-27 NOTE — ED Notes (Addendum)
Pt belongings placed in 1 bag at nurses station. Pt cell phone and $63 placed with belongings. Money was counted by this RN and Lurena Joiner, Charity fundraiser. Shoes, shirt, shorts, hat placed in bag with cell phone and money.

## 2021-01-27 NOTE — ED Provider Notes (Signed)
[facility]-EMERGENCY DEPT Provider Note   CSN: [id number] Arrival date & time: 01/27/21  2155     History Chief Complaint  Patient presents with  . Psychiatric Evaluation    Bryce Leon is a 30 y.o. male with a history of ADHD, anxiety, depression, bipolar disorder who presents the emergency department under IVC.  Per IVC paperwork: " Respondent stated that he wanted a gun to deal with people who anger him. He wants to die He smokes cannabis daily (at least 10 times per day) He was hospitalized about 10 days after leaving the propane stove on to see if he will wake up He states he is God"  In the ED, the patient states that he and his girlfriend of 4 years have been fighting more over the last week.  He states that his voice is hoarse from all of the yelling.  He states that his girlfriend took out IVC paperwork on him when really she is the one that needs IVC.  He states that when his IVC is rescinded that he plans to IVC her.  He states " she is crazy.  She is driving erratically and trying to run over pedestrians.  She keeps give doing and resending this camper to me to live in.  Were breaking up over this."  He denies SI, HI, or auditory visual hallucinations.  He endorses cannabis and tobacco use.  He denies any other illicit or recreational substance use.  He also reports a history of GERD, but does not take any daily medications.  He has not been on any antipsychotic medications.   He denies shortness of breath, chest pain, diarrhea, abdominal pain, fever, chills, headache, numbness, weakness.  Per chart review, he did just have a 5-day inpatient hospitalization from May 20 through 25.  The history is provided by the patient and medical records. No language interpreter was used.       Past Medical History:  Diagnosis Date  . ADHD (attention deficit hyperactivity disorder)   . Anxiety   . Arthritis   . Bipolar disorder (HCC)   . Depression      Patient Active Problem List   Diagnosis Date Noted  . Bipolar disorder (HCC) 01/18/2021  . Infected pilonidal cyst 02/05/2014  . HTN (hypertension) 12/03/2011  . 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 Altered mental status 12/02/2011  . Acute psychosis (HCC) 12/02/2011  . Substance abuse (HCC) 12/02/2011  . Leukocytosis 12/02/2011    History reviewed. No pertinent surgical history.     History reviewed. No pertinent family history.  Social History   Tobacco Use  . Smoking status: Current Every Day Smoker    Types: Cigarettes    Start date: 05/27/2005  . Smokeless tobacco: Former Neurosurgeon    Types: Snuff, Chew  Substance Use Topics  . Alcohol use: Yes    Alcohol/week: 28.0 standard drinks    Types: 21 Cans of beer, 7 Shots of liquor per week  . Drug use: Yes    Types: Marijuana    Comment: last used about 3 weeks ago    Home Medications Prior to Admission medications   Medication Sig Start Date End Date Taking? Authorizing Provider  divalproex (DEPAKOTE) 500 MG DR tablet Take 2 tablets by mouth twice daily Patient not taking: No sig reported 06/08/16 01/28/21  Patrick North, MD  QUEtiapine (SEROQUEL) 25 MG tablet Take 1 tablet (25 mg total) by mouth 2 (two)  times daily. Patient not taking: Reported on 01/18/2021 06/23/16 01/28/21  Patrick North, MD    Allergies    Patient has no known allergies.  Review of Systems   Review of Systems  Constitutional: Negative for appetite change and fever.  HENT: Positive for voice change. Negative for congestion and sore throat.   Respiratory: Negative for shortness of breath and wheezing.   Cardiovascular: Negative for chest pain and palpitations.  Gastrointestinal: Negative for abdominal pain, diarrhea, nausea and vomiting.  Genitourinary: Negative for dysuria.  Musculoskeletal: Negative for back pain, myalgias, neck pain and neck stiffness.  Skin: Negative for rash.   Allergic/Immunologic: Negative for immunocompromised state.  Neurological: Negative for dizziness, seizures, syncope, weakness, numbness and headaches.  Psychiatric/Behavioral: Negative for confusion.    Physical Exam Updated Vital Signs BP (!) 145/97   Pulse 98   Temp 98.9 F (37.2 C) (Oral)   Resp 18   Ht 6' (1.829 m)   Wt 93.2 kg   SpO2 100%   BMI 27.87 kg/m   Physical Exam Vitals and nursing note reviewed.  Constitutional:      Appearance: He is well-developed.     Comments: NAD  HENT:     Head: Normocephalic.  Eyes:     Conjunctiva/sclera: Conjunctivae normal.  Cardiovascular:     Rate and Rhythm: Normal rate and regular rhythm.     Pulses: Normal pulses.     Heart sounds: Normal heart sounds. No murmur heard. No friction rub. No gallop.   Pulmonary:     Effort: Pulmonary effort is normal. No respiratory distress.     Breath sounds: No stridor. No wheezing, rhonchi or rales.  Chest:     Chest wall: No tenderness.  Abdominal:     General: There is no distension.     Palpations: Abdomen is soft. There is no mass.     Tenderness: There is no abdominal tenderness. There is no right CVA tenderness, left CVA tenderness, guarding or rebound.     Hernia: No hernia is present.  Musculoskeletal:        General: No tenderness.     Cervical back: Neck supple.     Right lower leg: No edema.  Skin:    General: Skin is warm and dry.     Coloration: Skin is not jaundiced or pale.     Comments: Scattered macular erythematous rash noted to the chest wall.   Neurological:     Mental Status: He is alert.  Psychiatric:        Attention and Perception: He does not perceive auditory or visual hallucinations.        Mood and Affect: Mood and affect normal.        Speech: Speech normal.        Behavior: Behavior normal. Behavior is not agitated, slowed, aggressive, withdrawn, hyperactive or combative. Behavior is cooperative.        Thought Content: Thought content does not  include homicidal or suicidal ideation. Thought content does not include homicidal or suicidal plan.     ED Results / Procedures / Treatments   Labs (all labs ordered are listed, but only abnormal results are displayed) Labs Reviewed  COMPREHENSIVE METABOLIC PANEL - Abnormal; Notable for the following components:      Result Value   AST 48 (*)    ALT 71 (*)    All other components within normal limits  RAPID URINE DRUG SCREEN, HOSP PERFORMED - Abnormal; Notable for the following components:  Tetrahydrocannabinol POSITIVE (*)    All other components within normal limits  CBC WITH DIFFERENTIAL/PLATELET - Abnormal; Notable for the following components:   WBC 11.2 (*)    MCHC 36.1 (*)    Monocytes Absolute 1.2 (*)    All other components within normal limits  RESP PANEL BY RT-PCR (FLU A&B, COVID) [id number]  ETHANOL    EKG EKG Interpretation  Date/Time:  Monday Jan 27 2021 22:53:13 EDT Ventricular Rate:  65 PR Interval:  161 QRS Duration: 87 QT Interval:  362 QTC Calculation: 377 R Axis:   37 Text Interpretation: Sinus rhythm Supraventricular bigeminy Confirmed by Lockie Mola, Adam ([id number]) on 01/27/2021 10:56:31 PM   Radiology No results found.  Procedures .Critical Care Performed by: Barkley Boards, PA-C Authorized by: Barkley Boards, PA-C   Critical care provider statement:    Critical care time (minutes):  40   Critical care time was exclusive of:  Separately billable procedures and treating other patients and teaching time   Critical care was necessary to treat or prevent imminent or life-threatening deterioration of the following conditions: Agitation.   Critical care was time spent personally by me on the following activities:  Ordering and review of laboratory studies, pulse oximetry, re-evaluation of patient's condition, review of old charts, obtaining history from patient or surrogate, examination of patient, evaluation of patient's response to treatment and  development of treatment plan with patient or surrogate   I assumed direction of critical care for this patient from another provider in my specialty: no       Medications Ordered in ED Medications  nicotine (NICODERM CQ - dosed in mg/24 hours) patch 21 mg (has no administration in time range)  alum & mag hydroxide-simeth (MAALOX/MYLANTA) 200-200-20 MG/5ML suspension 30 mL (has no administration in time range)  ondansetron (ZOFRAN) tablet 4 mg (has no administration in time range)  acetaminophen (TYLENOL) tablet 650 mg (has no administration in time range)  LORazepam (ATIVAN) injection 0-4 mg (0 mg Intravenous Not Given 01/28/21 0047)    Or  LORazepam (ATIVAN) tablet 0-4 mg ( Oral See Alternative 01/28/21 0047)  LORazepam (ATIVAN) injection 0-4 mg (has no administration in time range)    Or  LORazepam (ATIVAN) tablet 0-4 mg (has no administration in time range)  thiamine tablet 100 mg (has no administration in time range)    Or  thiamine (B-1) injection 100 mg (has no administration in time range)  melatonin tablet 3 mg (3 mg Oral Not Given 01/28/21 0047)  naproxen (NAPROSYN) tablet 500 mg (500 mg Oral Given 01/27/21 2353)  ketamine (KETALAR) injection 375 mg (375 mg Intramuscular Given 01/28/21 0007)    ED Course  I have reviewed the triage vital signs and the nursing notes.  Pertinent labs & imaging results that were available during my care of the patient were reviewed by me and considered in my medical decision making (see chart for details).  Clinical Course as of 01/28/21 0132  Tue Jan 28, 2021  0126 On reevaluation, patient is calm and cooperative.  Ketamine appears to be still metabolizing as patient is telling staff that he loves them.  No continued agitation.  However, patient is intermittently trying to climb over the bed rails.  We will place the patient in soft restraints for safety while he is metabolizing ketamine. [MM]    Clinical Course User Index [MM] Delories Mauri, Coral Else,  PA-C   MDM Rules/Calculators/A&P  30 year old male with a history of ADHD, anxiety, depression, bipolar disorder who presents emergency department under IVC.  First examination completed by Dr. Pilar PlateBero, attending physician.  Patient was IVC by his significant other over concerns for HI and SI, which the patient denies to me.  On exam, he has calm and cooperative with normal mood and affect.  He has no other complaints at this time.  Vital signs are stable.  Labs of been reviewed and independently interpreted by me.  Discuss[facility] the patient with Nira ConnJason Berry, [facility]. No adult beds are available at this time.   Shortly after my initial evaluation, the patient became increasingly agitated.  He began yelling at visitors of other patients, staff, standing in the doorway, and becoming increasingly confrontational demanding to leave.  Attempted redirection with food, providing him naproxen for back pain, and face-to-face reassessment without improvement in agitation.  Ketamine given.  We will continue to reevaluate the patient closely, and he later required Geodon and soft restraints.  Soft restraints have since been removed.  Pt medically cleared at this time. Psych hold orders and home med orders placed. TTS consult pending; please see psych team notes for further documentation of care/dispo. Pt stable at time of med clearance.     Final Clinical Impression(s) / ED Diagnoses Final diagnoses:  None    Rx / DC Orders ED Discharge Orders    None       Barkley BoardsMcDonald, Aki Burdin A, PA-C 01/28/21 0540    Sabas SousBero, Michael M, MD 01/28/21 [phone]

## 2021-01-27 NOTE — ED Notes (Signed)
Pt placed in burgundy scrubs. Security called to wand pt

## 2021-01-27 NOTE — ED Triage Notes (Signed)
Arrived in police custody, IVC taken out by pt girlfriend. Girlfriend states that pt left propane stove on to see if he will wake up. Also girlfriend states he said he wanted to get a gun and kill everyone that angered him. Pt denies SI or HI and admits to the stove incident and says that happened 3 months ago. Pt denies other event. Hx of bipolar disorder, has been off of medications for 7 years

## 2021-01-28 MED ORDER — LORAZEPAM 2 MG/ML IJ SOLN
0.0000 mg | Freq: Two times a day (BID) | INTRAMUSCULAR | Status: DC
  Administered 2021-01-28: 2 mg via INTRAVENOUS

## 2021-01-28 MED ORDER — ONDANSETRON HCL 4 MG PO TABS: 4.0000 mg | ORAL_TABLET | Freq: Three times a day (TID) | ORAL | Status: DC | PRN

## 2021-01-28 MED ORDER — ACETAMINOPHEN 325 MG PO TABS
650.0000 mg | ORAL_TABLET | ORAL | Status: DC | PRN
  Administered 2021-01-28: 650 mg via ORAL
  Filled 2021-01-28: qty 2

## 2021-01-28 MED ORDER — LORAZEPAM 2 MG/ML IJ SOLN
0.0000 mg | Freq: Four times a day (QID) | INTRAMUSCULAR | Status: DC
  Administered 2021-01-28: 2 mg via INTRAVENOUS
  Filled 2021-01-28: qty 1

## 2021-01-28 MED ORDER — THIAMINE HCL 100 MG PO TABS
100.0000 mg | ORAL_TABLET | Freq: Every day | ORAL | Status: DC
  Administered 2021-01-28: 100 mg via ORAL
  Filled 2021-01-28: qty 1

## 2021-01-28 MED ORDER — ALUM & MAG HYDROXIDE-SIMETH 200-200-20 MG/5ML PO SUSP: 30.0000 mL | Freq: Four times a day (QID) | ORAL | Status: DC | PRN

## 2021-01-28 MED ORDER — ZIPRASIDONE MESYLATE 20 MG IM SOLR
20.0000 mg | Freq: Once | INTRAMUSCULAR | Status: AC
  Administered 2021-01-28: 20 mg via INTRAMUSCULAR
  Filled 2021-01-28: qty 20

## 2021-01-28 MED ORDER — THIAMINE HCL 100 MG/ML IJ SOLN: 100.0000 mg | Freq: Every day | INTRAMUSCULAR | Status: DC

## 2021-01-28 MED ORDER — LORAZEPAM 1 MG PO TABS: 0.0000 mg | ORAL_TABLET | Freq: Two times a day (BID) | ORAL | Status: DC

## 2021-01-28 MED ORDER — STERILE WATER FOR INJECTION IJ SOLN
INTRAMUSCULAR | Status: AC
  Filled 2021-01-28: qty 10

## 2021-01-28 MED ORDER — NICOTINE 21 MG/24HR TD PT24
21.0000 mg | MEDICATED_PATCH | Freq: Every day | TRANSDERMAL | Status: DC
  Administered 2021-01-28: 21 mg via TRANSDERMAL
  Filled 2021-01-28: qty 1

## 2021-01-28 MED ORDER — MELATONIN 3 MG PO TABS: 3.0000 mg | ORAL_TABLET | Freq: Every day | ORAL | Status: DC

## 2021-01-28 MED ORDER — LORAZEPAM 1 MG PO TABS: 0.0000 mg | ORAL_TABLET | Freq: Four times a day (QID) | ORAL | Status: DC

## 2021-01-28 NOTE — ED Notes (Signed)
Pt sleeping. 

## 2021-01-28 NOTE — ED Notes (Signed)
Patient removed himself from posey belt.

## 2021-01-28 NOTE — ED Notes (Signed)
Loud noise heard from patient's room. Patient flailing in bed attempting to flip bed. Patient redirected.

## 2021-01-28 NOTE — ED Notes (Signed)
Pt refused ativan.

## 2021-01-28 NOTE — ED Notes (Signed)
Unable to update pt vitals due to Pt being uncooperative.

## 2021-01-28 NOTE — ED Notes (Signed)
Patient allowed to shower.

## 2021-01-28 NOTE — ED Notes (Signed)
Pt out of room again and stuck his middle finger up at staff for telling him to go back to his room

## 2021-01-28 NOTE — ED Notes (Signed)
Pt continuing to stand at door and will not leave the door closed after being told multiple times to stay in the room. Pt notified that he will be placed back in non-violent restraints if he does not follow instructions. He is a fall risk due to the sedation medications he received.

## 2021-01-28 NOTE — ED Notes (Addendum)
Patient with multiple instances of agitation towards staff and patient's visitors. Patient cursing at staff and visitors, obviously in a delusion state. [name] emotions very labile and actions are impulsive. Patient will be cooperative and then suddenly will get agitated and verbally aggressive to perceived threats. PA made aware that geodon previously was unsuccessful.

## 2021-01-28 NOTE — ED Notes (Signed)
Pt speaking with other pts family members when they are outside of the room. Pt called another pts family member explicit words and threatened them. Provider notified that pt is not redirectable and will not stay in his room and is verbally assaulting other visitors and staff

## 2021-01-28 NOTE — ED Notes (Signed)
Patient continues to be verbally aggressive and threatening towards staff. PA aware and will order medication. Security called to the bedside to assist with medication administration.

## 2021-01-28 NOTE — BH Assessment (Addendum)
[facility] Assessment Progress Note  Per Vernard Gambles, NP, this pt does not require psychiatric hospitalization at this time.  Pt presents under IVC initiated by pt's girlfriend and upheld by EDP Kennis Carina, MD, which has been rescinded by EDP Lorre Nick, MD.  Pt is psychiatrically cleared.  Discharge instructions include referral information for [facility].  Dr Freida Busman and pt's nurse, Gabriel Rung, have been notified.  Doylene Canning, MA Triage Specialist [phone]

## 2021-01-28 NOTE — ED Notes (Signed)
Pt urinated in the bed and in scrubs. Restraints removed so pt could change in the bathroom. Pt is cooperative at present time. Bilateral wrist restraints removed. Pt notified if he starts becoming uncooperative, the restraints will go back on. Pt verbalizes understanding and is resting quietly in his room

## 2021-01-28 NOTE — ED Notes (Addendum)
Pt placed in non-violent restraints again due to verbal threats and name calling to staff and refusal to stay in his room

## 2021-01-28 NOTE — ED Notes (Signed)
Pt is verbally abusive toward staff, pt is not redirectable, pt will not stay in room and continues to argue with staff.

## 2021-01-28 NOTE — ED Notes (Signed)
Pt out of bed at door. Unsteady on his feet. Pt easily redirectable back to bed.

## 2021-01-28 NOTE — Discharge Instructions (Signed)
For your behavioral Health needs, you are advised to follow up with [facility]:      [facility]      [address]      [place], [place] [address]      [phone]      They offer psychiatry/medication management and therapy.  New patients are being seen in their walk-in clinic.  Walk-in hours are Monday - Thursday from 8:00 am - 11:00 am for psychiatry, and Friday from 1:00 pm - 4:00 pm for therapy.  Walk-in patients are seen on a first come, first served basis, so try to arrive as early as possible for the best chance of being seen the same day.
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
## 2021-01-28 NOTE — ED Notes (Signed)
Posey removed from pt. Pt ambulatory to bathroom and is steady on his feet

## 2021-01-28 NOTE — ED Notes (Signed)
Posey belt applied to pt for his safety. Pt is not steady on his feet after medication and continues to get up out of bed

## 2021-01-28 NOTE — ED Notes (Signed)
Anxiety medication requested from provider for pt

## 2021-01-28 NOTE — ED Notes (Signed)
Pt standing outside of room. Pt requests to pace back and forth in the hallway. Pt notified he cannot do that and he needs to stay in his room. Pt getting agitated

## 2021-01-28 NOTE — ED Notes (Signed)
Pt placed on monitor.  

## 2021-01-28 NOTE — ED Notes (Signed)
Patient out of rooms multiple times, rapid speech and tangential, refusing to follow commands and is continually coming out of the room. Patient redirected multiple times without success.

## 2021-01-28 NOTE — ED Notes (Addendum)
Pt resting in bed after medications per MAR. Pt continuously saying "I love y'all". Pt staying in bed and speaking to himself in a delusional state.

## 2022-08-05 DIAGNOSIS — M9904 Segmental and somatic dysfunction of sacral region: Secondary | ICD-10-CM | POA: Diagnosis not present

## 2022-08-05 DIAGNOSIS — M9903 Segmental and somatic dysfunction of lumbar region: Secondary | ICD-10-CM | POA: Diagnosis not present

## 2022-08-05 DIAGNOSIS — M9905 Segmental and somatic dysfunction of pelvic region: Secondary | ICD-10-CM | POA: Diagnosis not present

## 2022-08-05 DIAGNOSIS — M5386 Other specified dorsopathies, lumbar region: Secondary | ICD-10-CM | POA: Diagnosis not present

## 2022-09-03 ENCOUNTER — Ambulatory Visit (INDEPENDENT_AMBULATORY_CARE_PROVIDER_SITE_OTHER): Payer: Medicaid Other | Admitting: Physician Assistant

## 2022-09-03 ENCOUNTER — Ambulatory Visit (INDEPENDENT_AMBULATORY_CARE_PROVIDER_SITE_OTHER): Payer: Medicaid Other

## 2022-09-03 DIAGNOSIS — M5440 Lumbago with sciatica, unspecified side: Secondary | ICD-10-CM

## 2022-09-03 DIAGNOSIS — M545 Low back pain, unspecified: Secondary | ICD-10-CM | POA: Insufficient documentation

## 2022-09-03 MED ORDER — MELOXICAM 15 MG PO TABS: 15.0000 mg | ORAL_TABLET | Freq: Every day | ORAL | 0 refills | Status: DC

## 2022-09-03 NOTE — Progress Notes (Addendum)
Office Visit Note   Patient: Bryce Leon           Date of Birth: 27-Dec-1990           MRN: [id number] Visit Date: 09/03/2022              Requested by: No referring provider defined for this encounter. PCP: Pcp, No  Chief Complaint  Patient presents with   Lower Back - Pain   Middle Back - Pain      HPI: Bryce Leon is a pleasant 32 year old gentleman with a chief complaint of back pain.  He said he has had ongoing difficulties with his back since he was a teenager.  He denies any specific injury.  He did undergo per his recollection a laminectomy about 7 years ago.  He did not have a fusion.  He continues to have problems with his back with pain radiating down his left leg.  Denies any loss of bowel or bladder control but does feel over time he is getting weaker.  Pain radiates from his posterior buttock down into his lower leg and he says it sometimes his leg just feels "weird ".  He has failed chiropractic care and has had injections in the past without any improvement.  Last MRI was in 20 patient's had a previous  Assessment & Plan: Visit Diagnoses:  1. Acute bilateral low back pain with sciatica, sciatica laterality unspecified     Plan: I reviewed the patient's history he did have an MRI done in 2021 by [facility].  It showed postoperative changes at L5-S1 and based on his incision this certainly seems appropriate.  Status post decompressive laminectomy.  At that time he did have disc desiccation and loss of disc height at L4-5.  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  He says he "just wants to get this fixed ".  We will order an updated MRI given his progressive symptoms of slowly getting more weak.  He will follow-up with Dr. Laurance Flatten to review this.  Will call him in some meloxicam in the meantime.  Understands not to take any other NSAIDs with this and to take it with food  Follow-Up Instructions: No follow-ups on file.   Ortho Exam  Patient is  alert, oriented, no adenopathy, well-dressed, normal affect, normal respiratory effort. Examination of his lower back he has good hip range of motion internal/external rotation without pain reproduced in the groin.  He does have left posterior buttock pain that radiates down his leg.  He has a positive straight leg raise.  His strength is 4 out of 5 with resisted dorsiflexion plantarflexion extension and flexion.  Imaging: XR Lumbar Spine 2-3 Views  Result Date: 09/03/2022 2 view radiographs of his lumbar spine 9 were reviewed today.  No listhesis.  No acute fracture.  He does have seen degenerative changes at L4-5 L5-S1 and L2-3.  Was loss of joint space at L2-3.  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  Labs: Lab Results  Component Value Date   HGBA1C 4.8 01/21/2021   REPTSTATUS 12/06/2011 FINAL 12/02/2011   REPTSTATUS 12/03/2011 FINAL 12/02/2011   GRAMSTAIN  12/02/2011    CYTOSPIN WBC NO ORGANISMS SEEN Gram Stain Report Called to,Read Back By and Verified With: Gram Stain Report Called to,Read Back By and Verified With: BOUFFARD D/ED 0142 12/03/11 BY Warren Danes S Performed by [place]  12/02/2011  NO ORGANISMS SEEN WBC PRESENT CYTOSPIN SMEAR CALLED TO BOUFFARD,D/ED @0142  ON 12/03/11 BY KARCZEWSKI,S.   CULT NO GROWTH 3 DAYS 12/02/2011     Lab Results  Component Value Date   ALBUMIN 4.4 01/27/2021   ALBUMIN 3.3 (L) 01/21/2021   ALBUMIN 4.0 01/17/2021    No results found for: "MG" No results found for: "VD25OH"  No results found for: "PREALBUMIN"    Latest Ref Rng & Units 01/27/2021   10:33 PM 01/21/2021    5:44 AM 01/17/2021    8:45 AM  CBC EXTENDED  WBC 4.0 - 10.5 K/uL 11.2  5.9  8.5   RBC 4.22 - 5.81 MIL/uL 4.90  5.02  5.22   Hemoglobin 13.0 - 17.0 g/dL 15.0  15.3  15.9   HCT 39.0 - 52.0 % 41.6  43.3  44.0   Platelets 150 - 400 K/uL 218  175  197   NEUT# 1.7 - 7.7 K/uL 7.4   6.2   Lymph# 0.7 -  4.0 K/uL 2.3   1.4      There is no height or weight on file to calculate BMI.  Orders:  Orders Placed This Encounter  Procedures   XR Lumbar Spine 2-3 Views   MR Lumbar Spine w/o contrast   Ambulatory referral to Orthopedic Surgery   Meds ordered this encounter  Medications   meloxicam (MOBIC) 15 MG tablet    Sig: Take 1 tablet (15 mg total) by mouth daily.    Dispense:  30 tablet    Refill:  0     Procedures: No procedures performed  Clinical Data: No additional findings.  ROS:  All other systems negative, except as noted in the HPI. Review of Systems  All other systems reviewed and are negative.  Objective: Vital Signs: There were no vitals taken for this visit.  Specialty Comments:  No specialty comments available.  PMFS History: Patient Active Problem List   Diagnosis Date Noted   Low back pain 09/03/2022   Bipolar disorder ([place]) 01/18/2021   Infected pilonidal cyst 02/05/2014   HTN (hypertension) 12/03/2011   Cannabis abuse with psychotic disorder ([place]) 12/03/2011    Class: Acute   Altered mental status 12/02/2011   Acute psychosis ([place]) 12/02/2011   Substance abuse ([place]) 12/02/2011   Leukocytosis 12/02/2011   Past Medical History:  Diagnosis Date   ADHD (attention deficit hyperactivity disorder)    Anxiety    Arthritis    Bipolar disorder ([place])    Depression     No family history on file.  No past surgical history on file. Social History   Occupational History   Not on file  Tobacco Use   Smoking status: Every Day    Types: Cigarettes    Start date: 05/27/2005   Smokeless tobacco: Former    Types: Snuff, Chew  Substance and Sexual Activity   Alcohol use: Yes    Alcohol/week: 28.0 standard drinks of alcohol    Types: 21 Cans of beer, 7 Shots of liquor per week   Drug use: Yes    Types: Marijuana    Comment: last used about 3 weeks ago   Sexual activity: Not Currently

## 2022-10-02 ENCOUNTER — Ambulatory Visit (INDEPENDENT_AMBULATORY_CARE_PROVIDER_SITE_OTHER): Payer: Medicaid Other

## 2022-10-02 ENCOUNTER — Encounter: Payer: Self-pay | Admitting: Orthopedic Surgery

## 2022-10-02 ENCOUNTER — Ambulatory Visit (INDEPENDENT_AMBULATORY_CARE_PROVIDER_SITE_OTHER): Payer: Medicaid Other | Admitting: Orthopedic Surgery

## 2022-10-02 VITALS — BP 129/91 | HR 82 | Ht 71.0 in | Wt 266.0 lb

## 2022-10-02 DIAGNOSIS — M549 Dorsalgia, unspecified: Secondary | ICD-10-CM | POA: Diagnosis not present

## 2022-10-02 DIAGNOSIS — M5440 Lumbago with sciatica, unspecified side: Secondary | ICD-10-CM

## 2022-10-02 MED ORDER — MELOXICAM 15 MG PO TABS: 15.0000 mg | ORAL_TABLET | Freq: Every day | ORAL | 0 refills | Status: DC

## 2022-10-02 NOTE — Progress Notes (Signed)
Orthopedic Spine Surgery Office Note  Assessment: Patient is a 32 y.o. male with history of L5/S1 laminectomy who presents with chronic low back pain and left-sided L5 or S1 radicular symptoms   Plan: -Explained that initially conservative treatment is tried as a significant number of patients may experience relief with these treatment modalities. Discussed that the conservative treatments include:  -activity modification  -physical therapy  -over the counter pain medications  -medrol dosepak  -lumbar steroid injections -Already has a lumbar MRI order, so we will follow-up on that -Prescribed Mobic to be taken once daily with food -Referred to physical therapy for core strengthening.  Instructed him to do core strengthening exercises 3-4 times per week outside of therapy -Encouraged him to lose weight to offload the lumbar spine -If we get to the point that surgery is considered an option, would need him to be completely nicotine free and down to a weight of 250lbs -Patient should return to office in 6 weeks, x-rays at next visit: none   Patient expressed understanding of the plan and all questions were answered to the patient's satisfaction.   ___________________________________________________________________________   History:  Patient is a 32 y.o. male who presents today for lumbar spine.  Patient reports a history of a L5/S1 laminectomy at an outside institution (~7 years ago) for radicular pain down the posterior aspect of his left leg.  He said he got about a year of relief after that surgery, but then symptoms returned. Most of his pain is in the lumbar back in the midline and lumbar paraspinal muscles. He has pain radiating down the posterior aspect of the left thigh and leg. He has gotten used to this pain over the years so this is more tolerable. No right sided symptoms. There was no trauma or injury that brought on the pain.    Weakness: denies Symptoms of imbalance:  denies Paresthesias and numbness: denies Bowel or bladder incontinence: denies Saddle anesthesia: denies  Treatments tried: activity modification, NSAIDs  Review of systems: Denies fevers and chills, night sweats, unexplained weight loss, history of cancer. Reports infrequent pain that wakes him at night  Past medical history: ADHD Anxiety Bipolar Chronic pain  Allergies: NKDA  Past surgical history:  5/S1 laminectomy  Social history: Reports use of nicotine product (smoking, vaping, patches, smokeless) Alcohol use: Yes, 1 drink per week Denies recreational drug use   Physical Exam:  BMI of 37  General: no acute distress, appears stated age Neurologic: alert, answering questions appropriately, following commands Respiratory: unlabored breathing on room air, symmetric chest rise Psychiatric: appropriate affect, normal cadence to speech   MSK (spine):  -Strength exam      Left  Right EHL    5/5  5/5 TA    5/5  5/5 GSC    5/5  5/5 Knee extension  5/5  5/5 Hip flexion   5/5  5/5  -Sensory exam    Sensation intact to light touch in L3-S1 nerve distributions of bilateral lower extremities  -Achilles DTR: 2/4 on the left, 2/4 on the right -Patellar tendon DTR: 2/4 on the left, 2/4 on the right  -Straight leg raise: Negative -Contralateral straight leg raise: Negative -Femoral nerve stretch test: Negative bilaterally -Clonus: no beats bilaterally  -Left hip exam: Back pain with FADIR, negative FABER, negative Stinchfield, negative SI joint compression test -Right hip exam: Back pain with FADIR, negative FABER, negative Stinchfield, negative SI joint compression test  Imaging: XR of the thoracic and lumbar spine from 10/02/2022  and 09/03/2022 was independently reviewed and interpreted, showing disc height loss at L5/S1.  No evidence of instability on flexion/extension views.  No fracture or dislocation.   Patient name: Bryce Leon Patient MRN:  [id number] Date of visit: 10/02/22

## 2022-11-09 ENCOUNTER — Ambulatory Visit: Payer: Medicaid Other | Attending: Orthopedic Surgery | Admitting: Physical Therapy

## 2022-11-09 ENCOUNTER — Other Ambulatory Visit: Payer: Self-pay

## 2022-11-09 DIAGNOSIS — M6281 Muscle weakness (generalized): Secondary | ICD-10-CM | POA: Diagnosis not present

## 2022-11-09 DIAGNOSIS — M5440 Lumbago with sciatica, unspecified side: Secondary | ICD-10-CM | POA: Diagnosis not present

## 2022-11-09 DIAGNOSIS — M5459 Other low back pain: Secondary | ICD-10-CM

## 2022-11-09 NOTE — Therapy (Signed)
OUTPATIENT PHYSICAL THERAPY EVALUATION   Patient Name: Bryce Leon MRN: [id number] DOB:Dec 22, 1990, 32 y.o., male Today's Date: 11/10/2022   END OF SESSION:  PT End of Session - 11/10/22 0823     Visit Number 1    Number of Visits 9    Date for PT Re-Evaluation 01/04/23    Authorization Type [employer]    Authorization - Number of Visits 27    PT Start Time [id number]    PT Stop Time 1700    PT Time Calculation (min) 45 min    Activity Tolerance Patient tolerated treatment well    Behavior During Therapy WFL for tasks assessed/performed             Past Medical History:  Diagnosis Date   ADHD (attention deficit hyperactivity disorder)    Anxiety    Arthritis    Bipolar disorder ([place])    Depression    History reviewed. No pertinent surgical history. Patient Active Problem List   Diagnosis Date Noted   Low back pain 09/03/2022   Bipolar disorder ([place]) 01/18/2021   Infected pilonidal cyst 02/05/2014   HTN (hypertension) 12/03/2011   Cannabis abuse with psychotic disorder ([place]) 12/03/2011    Class: Acute   Altered mental status 12/02/2011   Acute psychosis ([place]) 12/02/2011   Substance abuse ([place]) 12/02/2011   Leukocytosis 12/02/2011    PCP: None  REFERRING PROVIDER: Callie Fielding, MD  REFERRING DIAG: Acute bilateral low back pain with sciatica, sciatica laterality unspecified  Rationale for Evaluation and Treatment: Rehabilitation  THERAPY DIAG:  Other low back pain  Muscle weakness (generalized)  ONSET DATE: ongoing many years   SUBJECTIVE:                                                                                                                                                                                          SUBJECTIVE STATEMENT: Patient reports low back stays so tight all the time and will sometimes get pain running down the legs, primarily the left. What is bothering him most right now is a burning in his upper back. Has been  going on for years, states a couple accidents over the years and has progressively gotten worse. He states that in the morning his lower back feels like "garbage" the first couple of hours. Any type of bending will aggravate the back, then sitting for extended periods and sometimes walking with increase the pain. He does have history of injections and a laminectomy in the lumbar region, also states neuropathy so will sometimes get numbness and tingling in the legs and feet.  PERTINENT HISTORY:  Reports history of  L5/S1 laminectomy in 2017  PAIN:  Are you having pain? Yes:  NPRS scale: 4/10 (pain at worse 10/10) Pain location: Mid - lower back, left leg Pain description: Burning, tight, tingling Aggravating factors: Bending over, sitting for long periods, walking, lifting Relieving factors: Hot shower,   PRECAUTIONS: None  WEIGHT BEARING RESTRICTIONS: No  FALLS:  Has patient fallen in last 6 months? No  OCCUPATION: No  PLOF: Independent  PATIENT GOALS: Pain relief   OBJECTIVE:  DIAGNOSTIC FINDINGS:  X-ray lumbar 09/03/2022 "No listhesis.  No acute fracture.  He does have seen degenerative changes at L4-5 L5-S1 and L2-3.  Was loss of joint space at L2-3.  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 "  PATIENT SURVEYS:  Modified Oswestry 60% disability   SCREENING FOR RED FLAGS: Negative  COGNITION: Overall cognitive status: Within functional limits for tasks assessed     SENSATION: WFL  MUSCLE LENGTH: Significant limitations in bilateral hamstring and hip flexibility  POSTURE:   Rounded shoulders and kyphotic mid-upper back position,   PALPATION: Global tenderness throughout lumbar and thoracic paraspinal region  LUMBAR ROM:   AROM eval  Flexion 25%  Extension 25%  Right lateral flexion 50%  Left lateral flexion 25%  Right rotation 50%  Left rotation 50%   (Blank rows = not tested)  LOWER EXTREMITY MMT:     MMT Right eval Left eval  Hip flexion 4 4   Hip extension 3 3  Hip abduction 3 3  Hip adduction    Hip internal rotation    Hip external rotation    Knee flexion 5 4+  Knee extension 4+ 4  Ankle dorsiflexion 5 5  Ankle plantarflexion    Ankle inversion    Ankle eversion     (Blank rows = not tested)  LUMBAR SPECIAL TESTS:  Straight leg raise test: Positive and Slump test: Positive  FUNCTIONAL TESTS:  DLLT: patient unable to maintain lumbar control and reports pain at all positons Lifting: patient demonstrates limited ability to lift from floor due to pain with forward bend, increased mid-upper back flexion and lumbar spine remains relative fixed  GAIT: Assistive device utilized: None Level of assistance: Complete Independence Comments: Trendelenburg L > R   TODAY'S TREATMENT:        [facility] Adult PT Treatment:                                                DATE: 11/09/2022 Therapeutic Exercise: LTR Hooklying SKTC stretch Piriformis stretch Bridge SLR with abdominal activity Side clamshell Seated hamstring stretch  PATIENT EDUCATION:  Education details: Exam findings, POC, HEP Person educated: Patient Education method: Explanation, Demonstration, Tactile cues, Verbal cues, and Handouts Education comprehension: verbalized understanding, returned demonstration, verbal cues required, tactile cues required, and needs further education  HOME EXERCISE PROGRAM: Access Code: [email or web address]    ASSESSMENT: CLINICAL IMPRESSION: Patient is a 32 y.o. male who was seen today for physical therapy evaluation and treatment for chronic low back pain with likely radicular symptoms primarily on the left. He has long history of low back pain with previous L5-S1 laminectomy. He exhibits limitations in lumbar motion and hip flexibility with inability to perform lifting or bending tasks due to pain, gross core and hip strength deficits likely contributing to pain with activity, and left radicular pain without dermatome or myotome deficits but  does  have greater weakness on the left side.   OBJECTIVE IMPAIRMENTS: decreased activity tolerance, decreased ROM, decreased strength, impaired flexibility, improper body mechanics, postural dysfunction, and pain.   ACTIVITY LIMITATIONS: carrying, lifting, bending, sitting, squatting, and locomotion level  PARTICIPATION LIMITATIONS: meal prep, cleaning, driving, shopping, community activity, and occupation  PERSONAL FACTORS: Fitness, Past/current experiences, Time since onset of injury/illness/exacerbation, and 3+ comorbidities: see PMH above  are also affecting patient's functional outcome.   REHAB POTENTIAL: Good  CLINICAL DECISION MAKING: Evolving/moderate complexity  EVALUATION COMPLEXITY: Moderate   GOALS: Goals reviewed with patient? Yes  SHORT TERM GOALS: Target date: 12/07/2022  Patient will be I with initial HEP in order to progress with therapy. Baseline: HEP provided at eval Goal status: INITIAL  2.  Patient will report pain with activity </= 7/10 in order to reduce functional limitations with household tasks Baseline: pain up to 10/10 Goal status: INITIAL  3.  Patient will demonstrate proper hip hinge mechanics and maintaining adequate lumbar control to reduce pain with lifting Baseline: patient exhibits deviations with lifting mechanics Goal status: INITIAL  LONG TERM GOALS: Target date: 01/04/2023  Patient will be I with final HEP to maintain progress from PT. Baseline: HEP provided at eval Goal status: INITIAL  2.  Patient will report modified oswestry </= 30% disability in order to indicate improved functional ability with daily tasks  Baseline: 60% disability Goal status: INITIAL  3.  Patient will demonstrate DLLT to 60 deg in order to indicate improved core strength and lumbopelvic control in order to improve ability to perform heavier household tasks and lifting with less pain Baseline: unable to hold DLLT at eval Goal status: INITIAL  4.  Patient will  demonstrate hip strength grossly >/= 4/5 MMT in order to improve his standing, walking, and lifting tolerance with less stress placed on lumbar spine Baseline: hip strength grossly 3/5 MMT Goal status: INITIAL   PLAN: PT FREQUENCY: 1x/week  PT DURATION: 8 weeks  PLANNED INTERVENTIONS: Therapeutic exercises, Therapeutic activity, Neuromuscular re-education, Balance training, Gait training, Patient/Family education, Self Care, Joint mobilization, Joint manipulation, Aquatic Therapy, Dry Needling, Electrical stimulation, Spinal manipulation, Spinal mobilization, Cryotherapy, Moist heat, Taping, Traction, Manual therapy, and Re-evaluation.  PLAN FOR NEXT SESSION: Review HEP and progress PRN, manual/dry needling for hip and lumbar region (history of laminectomy), continue with stretching and lumbar mobility, gradual progression of core stabilization   Hilda Blades, PT, DPT, LAT, ATC 11/10/22  8:55 AM Phone: [phone] Fax: [phone]

## 2022-11-09 NOTE — Patient Instructions (Signed)
Access Code: [email or web address] URL: [email or web address] Date: 11/09/2022 Prepared by: Hilda Blades  Exercises - Supine Lower Trunk Rotation  - 1 x daily - 5 reps - 10 seconds hold - Hooklying Single Knee to Chest Stretch  - 1 x daily - 3 reps - 20 seconds hold - Supine Piriformis Stretch with Foot on Ground  - 1 x daily - 3 reps - 20 seconds hold - Supine Bridge  - 1 x daily - 3 sets - 5 reps - Straight Leg Raise  - 1 x daily - 3 sets - 5 reps - Clamshell  - 1 x daily - 2 sets - 10 reps - Seated Hamstring Stretch  - 1 x daily - 3 reps - 20 seconds hold

## 2022-11-10 ENCOUNTER — Encounter: Payer: Self-pay | Admitting: Physical Therapy

## 2022-11-13 ENCOUNTER — Ambulatory Visit (INDEPENDENT_AMBULATORY_CARE_PROVIDER_SITE_OTHER): Payer: Medicaid Other | Admitting: Orthopedic Surgery

## 2022-11-13 DIAGNOSIS — M5416 Radiculopathy, lumbar region: Secondary | ICD-10-CM | POA: Diagnosis not present

## 2022-11-13 MED ORDER — MELOXICAM 15 MG PO TABS: 15.0000 mg | ORAL_TABLET | Freq: Every day | ORAL | 0 refills | Status: AC

## 2022-11-13 MED ORDER — METHOCARBAMOL 500 MG PO TABS: 500.0000 mg | ORAL_TABLET | Freq: Three times a day (TID) | ORAL | 0 refills | Status: DC | PRN

## 2022-11-13 NOTE — Progress Notes (Signed)
Orthopedic Spine Surgery Office Note  Assessment: Patient is a 32 y.o. male with history of L5/S1 laminectomy who presents with chronic low back pain and left-sided posterior thigh and leg pain   Plan: -Patient has tried activity modification, physical therapy, Tylenol, Mobic -Patient has tried home exercise program and Mobic for 6 weeks now without any significant relief, so recommend MRI of the lumbar spine to evaluate for radiculopathy -Provided him with a new prescription of Mobic.  For his paraspinal type pain, recommend trial of Robaxin which was prescribed to him today -Patient should return to office in 5 weeks, x-rays at next visit: None   Patient expressed understanding of the plan and all questions were answered to the patient's satisfaction.   ___________________________________________________________________________  History: Patient is a 32 y.o. male who has been previously seen in the office for symptoms of low back pain that radiates into his left buttock and posterior thigh and leg.  His back pain still is most significant pain.  He states that the Mobic helped decrease the severity of the pain but did not completely relieve it.  He is still not having any right-sided symptoms.  Denies paresthesias and numbness.  No bowel or bladder incontinence.  No saddle anesthesia.  Previous treatments: activity modification, tylenol, mobic, physical therapy  Physical Exam:  General: no acute distress, appears stated age Neurologic: alert, answering questions appropriately, following commands Respiratory: unlabored breathing on room air, symmetric chest rise Psychiatric: appropriate affect, normal cadence to speech   MSK (spine):   -Strength exam                                                   Left                  Right EHL                              5/5                  5/5 TA                                 5/5                  5/5 GSC                             5/5                   5/5 Knee extension            5/5                  5/5 Hip flexion                    5/5                  5/5   -Sensory exam                           Sensation intact to light touch in L3-S1 nerve distributions of bilateral lower extremities   -  Achilles DTR: 2/4 on the left, 2/4 on the right -Patellar tendon DTR: 2/4 on the left, 2/4 on the right   -Straight leg raise: Negative -Contralateral straight leg raise: Negative -Femoral nerve stretch test: Negative bilaterally -Clonus: no beats bilaterally   -Left hip exam: Back pain with FADIR, negative FABER, negative Stinchfield, negative SI joint compression test -Right hip exam: Back pain with FADIR, negative FABER, negative Stinchfield, negative SI joint compression test  -TTP over the lumbar spine midline and paraspinal   Imaging: XR of the thoracic and lumbar spine from 10/02/2022 and 09/03/2022 was independently reviewed and interpreted, showing disc height loss at L5/S1.  No evidence of instability on flexion/extension views.  No fracture or dislocation.    Patient name: Bryce Leon Patient MRN: [id number] Date of visit: 11/13/22

## 2022-11-23 ENCOUNTER — Ambulatory Visit: Payer: Medicaid Other | Admitting: Physical Therapy

## 2022-11-23 ENCOUNTER — Encounter: Payer: Self-pay | Admitting: Physical Therapy

## 2022-11-23 DIAGNOSIS — M6281 Muscle weakness (generalized): Secondary | ICD-10-CM

## 2022-11-23 DIAGNOSIS — M5459 Other low back pain: Secondary | ICD-10-CM

## 2022-11-23 DIAGNOSIS — M5440 Lumbago with sciatica, unspecified side: Secondary | ICD-10-CM | POA: Diagnosis not present

## 2022-11-23 NOTE — Therapy (Signed)
OUTPATIENT PHYSICAL THERAPY TREATMENT NOTE   Patient Name: Bryce Leon MRN: [id number] DOB:11-Dec-1990, 32 y.o., male Today's Date: 11/23/2022  PCP: None   REFERRING PROVIDER: Callie Fielding, MD  END OF SESSION:   PT End of Session - 11/23/22 1406     Visit Number 2    Number of Visits 9    Date for PT Re-Evaluation 01/04/23    Authorization Type [employer]    PT Start Time [id number]    PT Stop Time 1441    PT Time Calculation (min) 38 min             Past Medical History:  Diagnosis Date   ADHD (attention deficit hyperactivity disorder)    Anxiety    Arthritis    Bipolar disorder ([place])    Depression    History reviewed. No pertinent surgical history. 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    Class: Acute   Altered mental status 12/02/2011   Acute psychosis ([place]) 12/02/2011   Substance abuse ([place]) 12/02/2011   Leukocytosis 12/02/2011    REFERRING DIAG: Acute bilateral low back pain with sciatica, sciatica laterality unspecified  THERAPY DIAG:  Other low back pain  Muscle weakness (generalized)  Rationale for Evaluation and Treatment Rehabilitation  PERTINENT HISTORY:  Reports history of L5/S1 laminectomy in 2017  PRECAUTIONS: None  SUBJECTIVE:                                                                                                                                                                                      SUBJECTIVE STATEMENT: I did the exercises.    PAIN:  Are you having pain? Yes:  NPRS scale: 6/10 upper back, 5/10 lower back  Pain location: Mid - lower back, left leg Pain description: Burning, tight, tingling Aggravating factors: Bending over, sitting for long periods, walking, lifting Relieving factors: Hot  shower,   OBJECTIVE: (objective measures completed at initial evaluation unless otherwise dated)   DIAGNOSTIC FINDINGS:  X-ray lumbar 09/03/2022 "No listhesis.  No acute fracture.  He does have seen degenerative changes at L4-5 L5-S1 and L2-3.  Was loss of joint space at L2-3.  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 "   PATIENT SURVEYS:  Modified Oswestry 60% disability    SCREENING FOR RED FLAGS: Negative   COGNITION: Overall cognitive status: Within functional limits for tasks assessed  SENSATION: WFL   MUSCLE LENGTH: Significant limitations in bilateral hamstring and hip flexibility   POSTURE:             Rounded shoulders and kyphotic mid-upper back position,    PALPATION: Global tenderness throughout lumbar and thoracic paraspinal region   LUMBAR ROM:    AROM eval  Flexion 25%  Extension 25%  Right lateral flexion 50%  Left lateral flexion 25%  Right rotation 50%  Left rotation 50%   (Blank rows = not tested)   LOWER EXTREMITY MMT:      MMT Right eval Left eval  Hip flexion 4 4  Hip extension 3 3  Hip abduction 3 3  Hip adduction      Hip internal rotation      Hip external rotation      Knee flexion 5 4+  Knee extension 4+ 4  Ankle dorsiflexion 5 5  Ankle plantarflexion      Ankle inversion      Ankle eversion       (Blank rows = not tested)   LUMBAR SPECIAL TESTS:  Straight leg raise test: Positive and Slump test: Positive   FUNCTIONAL TESTS:  DLLT: patient unable to maintain lumbar control and reports pain at all positons Lifting: patient demonstrates limited ability to lift from floor due to pain with forward bend, increased mid-upper back flexion and lumbar spine remains relative fixed   GAIT: Assistive device utilized: None Level of assistance: Complete Independence Comments: Trendelenburg L > R     TODAY'S TREATMENT:        [facility] Adult PT Treatment:                                                DATE:  11/23/22 Therapeutic Exercise: Nustep L5 UE/LE x 5 minutes  LTR Hooklying SKTC stretch Piriformis stretch Bridge SLR with abdominal activity Side clamshell Seated hamstring stretch  Seated lumbar flexion with physio ball 10 sec x 5    [facility] Adult PT Treatment:                                                DATE: 11/09/2022 Therapeutic Exercise: LTR Hooklying SKTC stretch Piriformis stretch Bridge SLR with abdominal activity Side clamshell Seated hamstring stretch   PATIENT EDUCATION:  Education details: Exam findings, POC, HEP Person educated: Patient Education method: Explanation, Demonstration, Tactile cues, Verbal cues, and Handouts Education comprehension: verbalized understanding, returned demonstration, verbal cues required, tactile cues required, and needs further education   HOME EXERCISE PROGRAM: Access Code: [email or web address]      ASSESSMENT: CLINICAL IMPRESSION: Patient is a 32 y.o. male who was seen today for physical therapy treatment for chronic low back pain with likely radicular symptoms primarily on the left. He has long history of low back pain with previous L5-S1 laminectomy. He reports compliance with HEP. He arrives with 5-6/10 pain in upper and lower back. Session focused on review of initial HEP. Began Nustep and progressed with lumbar stretching. He felt good stretch with seated lumbar flexion. He was encouraged to use home TENS and heat if sore. At end of session he reported slight increase in pain but also felt more loose in back.  He exhibits limitations in lumbar motion and hip flexibility with inability to perform lifting or bending tasks due to pain, gross core and hip strength deficits likely contributing to pain with activity, and left radicular pain without dermatome or myotome deficits but does have greater weakness on the left side.     OBJECTIVE IMPAIRMENTS: decreased activity tolerance, decreased ROM, decreased strength, impaired flexibility,  improper body mechanics, postural dysfunction, and pain.    ACTIVITY LIMITATIONS: carrying, lifting, bending, sitting, squatting, and locomotion level   PARTICIPATION LIMITATIONS: meal prep, cleaning, driving, shopping, community activity, and occupation   PERSONAL FACTORS: Fitness, Past/current experiences, Time since onset of injury/illness/exacerbation, and 3+ comorbidities: see PMH above  are also affecting patient's functional outcome.    REHAB POTENTIAL: Good   CLINICAL DECISION MAKING: Evolving/moderate complexity   EVALUATION COMPLEXITY: Moderate     GOALS: Goals reviewed with patient? Yes   SHORT TERM GOALS: Target date: 12/07/2022   Patient will be I with initial HEP in order to progress with therapy. Baseline: HEP provided at eval Goal status: INITIAL   2.  Patient will report pain with activity </= 7/10 in order to reduce functional limitations with household tasks Baseline: pain up to 10/10 Goal status: INITIAL   3.  Patient will demonstrate proper hip hinge mechanics and maintaining adequate lumbar control to reduce pain with lifting Baseline: patient exhibits deviations with lifting mechanics Goal status: INITIAL   LONG TERM GOALS: Target date: 01/04/2023   Patient will be I with final HEP to maintain progress from PT. Baseline: HEP provided at eval Goal status: INITIAL   2.  Patient will report modified oswestry </= 30% disability in order to indicate improved functional ability with daily tasks  Baseline: 60% disability Goal status: INITIAL   3.  Patient will demonstrate DLLT to 60 deg in order to indicate improved core strength and lumbopelvic control in order to improve ability to perform heavier household tasks and lifting with less pain Baseline: unable to hold DLLT at eval Goal status: INITIAL   4.  Patient will demonstrate hip strength grossly >/= 4/5 MMT in order to improve his standing, walking, and lifting tolerance with less stress placed on lumbar  spine Baseline: hip strength grossly 3/5 MMT Goal status: INITIAL     PLAN: PT FREQUENCY: 1x/week   PT DURATION: 8 weeks   PLANNED INTERVENTIONS: Therapeutic exercises, Therapeutic activity, Neuromuscular re-education, Balance training, Gait training, Patient/Family education, Self Care, Joint mobilization, Joint manipulation, Aquatic Therapy, Dry Needling, Electrical stimulation, Spinal manipulation, Spinal mobilization, Cryotherapy, Moist heat, Taping, Traction, Manual therapy, and Re-evaluation.   PLAN FOR NEXT SESSION: Review HEP and progress PRN, manual/dry needling for hip and lumbar region (history of laminectomy), continue with stretching and lumbar mobility, gradual progression of core stabilization     Hessie Diener, PTA 11/23/22 2:51 PM Phone: [phone] Fax: [phone]

## 2022-11-30 ENCOUNTER — Encounter: Payer: Self-pay | Admitting: Physical Therapy

## 2022-11-30 ENCOUNTER — Other Ambulatory Visit: Payer: Self-pay

## 2022-11-30 ENCOUNTER — Ambulatory Visit: Payer: Medicaid Other | Attending: Orthopedic Surgery | Admitting: Physical Therapy

## 2022-11-30 DIAGNOSIS — M5441 Lumbago with sciatica, right side: Secondary | ICD-10-CM | POA: Diagnosis not present

## 2022-11-30 DIAGNOSIS — M6281 Muscle weakness (generalized): Secondary | ICD-10-CM | POA: Insufficient documentation

## 2022-11-30 DIAGNOSIS — M5459 Other low back pain: Secondary | ICD-10-CM | POA: Diagnosis present

## 2022-11-30 DIAGNOSIS — M5442 Lumbago with sciatica, left side: Secondary | ICD-10-CM | POA: Insufficient documentation

## 2022-11-30 NOTE — Therapy (Addendum)
OUTPATIENT PHYSICAL THERAPY TREATMENT NOTE  DISCHARGE   Patient Name: Bryce Leon MRN: [id number] DOB:1991-01-05, 32 y.o., male Today's Date: 11/30/2022  PCP: None REFERRING PROVIDER: London Sheer, MD   END OF SESSION:   PT End of Session - 11/30/22 1424     Visit Number 3    Number of Visits 9    Date for PT Re-Evaluation 01/04/23    Authorization Type [employer]    Authorization - Number of Visits 27    PT Start Time 1400    PT Stop Time 1440    PT Time Calculation (min) 40 min    Activity Tolerance Patient tolerated treatment well    Behavior During Therapy WFL for tasks assessed/performed              Past Medical History:  Diagnosis Date   ADHD (attention deficit hyperactivity disorder)    Anxiety    Arthritis    Bipolar disorder    Depression    History reviewed. No pertinent surgical history. 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  PAIN:  Are you having pain? Yes:  NPRS scale: 6/10 upper back, 7/10 lower back  Pain location: Mid - lower back, left leg Pain description: Burning, tight, tingling Aggravating factors: Bending over, sitting for long periods,  walking, lifting Relieving factors: Hot shower   OBJECTIVE: (objective measures completed at initial evaluation unless otherwise dated) DIAGNOSTIC FINDINGS:  X-ray lumbar 09/03/2022 "No listhesis.  No acute fracture.  He does have seen degenerative changes at L4-5 L5-S1 and L2-3.  Was loss of joint space at L2-3.  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 "   PATIENT SURVEYS:  Modified Oswestry 60% disability   MUSCLE LENGTH: Significant limitations in bilateral hamstring and hip flexibility   POSTURE:             Rounded shoulders and kyphotic mid-upper back position,    PALPATION: Global tenderness throughout lumbar and thoracic paraspinal region   LUMBAR ROM:    AROM eval  Flexion 25%  Extension 25%  Right lateral flexion 50%  Left lateral flexion 25%  Right rotation 50%  Left rotation 50%   (Blank rows = not tested)   LOWER EXTREMITY MMT:      MMT Right eval Left eval Rt / Lt 11/30/2022  Hip flexion 4 4   Hip extension 3 3 3+ / 3+  Hip abduction 3 3 3+ / 3+  Hip adduction       Hip internal rotation       Hip external rotation       Knee flexion 5 4+   Knee extension 4+ 4   Ankle dorsiflexion  5 5   Ankle plantarflexion       Ankle inversion       Ankle eversion        (Blank rows = not tested)   LUMBAR SPECIAL TESTS:  Straight leg raise test: Positive and Slump test: Positive   FUNCTIONAL TESTS:  DLLT: patient unable to maintain lumbar control and reports pain at all positons Lifting: patient demonstrates limited ability to lift from floor due to pain with forward bend, increased mid-upper back flexion and lumbar spine remains relative fixed   GAIT: Assistive device utilized: None Level of assistance: Complete Independence Comments: Trendelenburg L > R     TODAY'S TREATMENT:        [facility] Adult PT Treatment:                                                DATE: 11/30/22 Therapeutic Exercise: Nustep L5 x 5 min with UE/LE while taking  subjective Supine sciatic nerve floss 2 x 5 LTR x 5 Piriformis stretch 2 x 30 sec each Modified thomas stretch x 60 sec each Prone quad stretch x 30 sec Prone hip extension 2 x 10 Sidelying hip abduction 2 x 10 each Bridge x 10 SLR with abdominal engagement x 10 each Manual: LAD hold x 4 bouts on left   [facility] Adult PT Treatment:                                                DATE: 11/23/22 Therapeutic Exercise: Nustep L5 UE/LE x 5 minutes  LTR Hooklying SKTC stretch Piriformis stretch Bridge SLR with abdominal activity Side clamshell Seated hamstring stretch  Seated lumbar flexion with physio ball 10 sec x 5   [facility] Adult PT Treatment:                                                DATE: 11/09/2022 Therapeutic Exercise: LTR Hooklying SKTC stretch Piriformis stretch Bridge SLR with abdominal activity Side clamshell Seated hamstring stretch   PATIENT EDUCATION:  Education details: HEP Person educated: Patient Education method: Programmer, multimedia, Facilities manager, Actor cues, Verbal cues Education comprehension: verbalized understanding, returned demonstration, verbal cues required, tactile cues required, and needs further education   HOME EXERCISE PROGRAM: Access Code: [email or web address]      ASSESSMENT: CLINICAL IMPRESSION: Patient tolerated therapy well with no adverse effects. He reports continued low back and left leg pain. Therapy continues to focus on progressing his mobility and strengthening as tolerated. He does exhibit a slight improvement in his hip strength and updated his HEP to progress strengthening for home. Trialed some manual LAD and sciatic nerve floss without much improvement in his symptoms. Patient would benefit from continued skilled PT to progress his mobility and strength in order to reduce pain and maximize functional ability.      OBJECTIVE IMPAIRMENTS: decreased activity tolerance, decreased ROM, decreased strength, impaired flexibility, improper body  mechanics, postural dysfunction, and pain.    ACTIVITY LIMITATIONS: carrying, lifting, bending, sitting, squatting, and locomotion level   PARTICIPATION LIMITATIONS: meal prep, cleaning, driving, shopping, community activity, and occupation  PERSONAL FACTORS: Fitness, Past/current experiences, Time since onset of injury/illness/exacerbation, and 3+ comorbidities: see PMH above  are also affecting patient's functional outcome.      GOALS: Goals reviewed with patient? Yes   SHORT TERM GOALS: Target date: 12/07/2022   Patient will be I with initial HEP in order to progress with therapy. Baseline: HEP provided at eval Goal status: INITIAL   2.  Patient will report pain with activity </= 7/10 in order to reduce functional limitations with household tasks Baseline: pain up to 10/10 Goal status: INITIAL   3.  Patient will demonstrate proper hip hinge mechanics and maintaining adequate lumbar control to reduce pain with lifting Baseline: patient exhibits deviations with lifting mechanics Goal status: INITIAL   LONG TERM GOALS: Target date: 01/04/2023   Patient will be I with final HEP to maintain progress from PT. Baseline: HEP provided at eval Goal status: INITIAL   2.  Patient will report modified oswestry </= 30% disability in order to indicate improved functional ability with daily tasks  Baseline: 60% disability Goal status: INITIAL   3.  Patient will demonstrate DLLT to 60 deg in order to indicate improved core strength and lumbopelvic control in order to improve ability to perform heavier household tasks and lifting with less pain Baseline: unable to hold DLLT at eval Goal status: INITIAL   4.  Patient will demonstrate hip strength grossly >/= 4/5 MMT in order to improve his standing, walking, and lifting tolerance with less stress placed on lumbar spine Baseline: hip strength grossly 3/5 MMT Goal status: INITIAL     PLAN: PT FREQUENCY: 1x/week   PT DURATION: 8 weeks    PLANNED INTERVENTIONS: Therapeutic exercises, Therapeutic activity, Neuromuscular re-education, Balance training, Gait training, Patient/Family education, Self Care, Joint mobilization, Joint manipulation, Aquatic Therapy, Dry Needling, Electrical stimulation, Spinal manipulation, Spinal mobilization, Cryotherapy, Moist heat, Taping, Traction, Manual therapy, and Re-evaluation.   PLAN FOR NEXT SESSION: Review HEP and progress PRN, manual/dry needling for hip and lumbar region (history of laminectomy), continue with stretching and lumbar mobility, gradual progression of core stabilization     Rosana Hoes, PT, DPT, LAT, ATC 11/30/22  2:51 PM Phone: [phone] Fax: [phone]      PHYSICAL THERAPY DISCHARGE SUMMARY  Visits from Start of Care: 3  Current functional level related to goals / functional outcomes: See above   Remaining deficits: See above   Education / Equipment: HEP   Patient agrees to discharge. Patient goals were not met. Patient is being discharged due to not returning since the last visit.  Rosana Hoes, PT, DPT, LAT, ATC 12/31/22  10:59 AM Phone: [phone] Fax: [phone]

## 2022-11-30 NOTE — Patient Instructions (Addendum)
Access Code: [email or web address] URL: [email or web address] Date: 11/30/2022 Prepared by: Hilda Blades  Exercises - Supine Lower Trunk Rotation  - 1 x daily - 5 reps - 10 seconds hold - Hooklying Single Knee to Chest Stretch  - 1 x daily - 3 reps - 20 seconds hold - Supine Piriformis Stretch with Foot on Ground  - 1 x daily - 3 reps - 20 seconds hold - Supine Bridge  - 1 x daily - 3 sets - 5 reps - Straight Leg Raise  - 1 x daily - 3 sets - 5 reps - Sidelying Hip Abduction  - 1 x daily - 2 sets - 10 reps - Seated Hamstring Stretch  - 1 x daily - 3 reps - 20 seconds hold

## 2022-12-07 ENCOUNTER — Ambulatory Visit: Payer: Medicaid Other | Admitting: Physical Therapy

## 2022-12-14 ENCOUNTER — Ambulatory Visit: Payer: Medicaid Other | Admitting: Physical Therapy

## 2022-12-18 ENCOUNTER — Ambulatory Visit: Payer: Medicaid Other | Admitting: Orthopedic Surgery

## 2022-12-21 ENCOUNTER — Encounter: Payer: Medicaid Other | Admitting: Physical Therapy

## 2022-12-28 ENCOUNTER — Encounter: Payer: Medicaid Other | Admitting: Physical Therapy

## 2023-02-17 ENCOUNTER — Other Ambulatory Visit: Payer: Self-pay | Admitting: Orthopedic Surgery

## 2023-02-17 MED ORDER — METHOCARBAMOL 500 MG PO TABS: 500.0000 mg | ORAL_TABLET | Freq: Three times a day (TID) | ORAL | 0 refills | Status: DC | PRN

## 2023-03-01 ENCOUNTER — Ambulatory Visit: Payer: Medicaid Other | Admitting: Orthopedic Surgery

## 2023-03-01 DIAGNOSIS — M5416 Radiculopathy, lumbar region: Secondary | ICD-10-CM | POA: Diagnosis not present

## 2023-03-01 MED ORDER — MELOXICAM 7.5 MG PO TABS: 7.5000 mg | ORAL_TABLET | Freq: Every day | ORAL | 2 refills | Status: DC

## 2023-03-01 NOTE — Progress Notes (Signed)
Orthopedic Spine Surgery Office Note   Assessment: Patient is a 32 y.o. male with history of L5/S1 laminectomy who presents with chronic low back pain and left-sided posterior thigh and leg pain     Plan: -Patient has tried activity modification, physical therapy, Tylenol, Mobic, robaxin -Patient did not get the MRI of his lumbar spine because of his social situation. He said he has gotten that straightened out. He has done over six weeks of conservative treatment without relief, so recommended MRI of the lumbar spine -Provided him with a new prescription of Mobic.  He has still been using the robaxin   -Recommended desensitization for his low back pain that is felt with superficial palpation -Patient should return to office in 5 weeks, x-rays at next visit: None     Patient expressed understanding of the plan and all questions were answered to the patient's satisfaction.    ___________________________________________________________________________   History: Patient is a 32 y.o. male who has been previously seen in the office for symptoms of low back pain that radiates into his left buttock and posterior thigh. His back pain still remains the most significant pain. He has the pain in the lower thoracic and throughout the lumbar spine. He feels it even with light touch on his back and prefers to sleep on his side as a result. He is still having left buttock and left posterior thigh pain which is unchanged since our last visit. No new pain radiating into either lower extremity.    Previous treatments: activity modification, tylenol, mobic, physical therapy, robaxin   Physical Exam:   General: no acute distress, appears stated age Neurologic: alert, answering questions appropriately, following commands Respiratory: unlabored breathing on room air, symmetric chest rise Psychiatric: appropriate affect, normal cadence to speech     MSK (spine):   -Strength exam                                                    Left                  Right EHL                              5/5                  5/5 TA                                 5/5                  5/5 GSC                             5/5                  5/5 Knee extension            5/5                  5/5 Hip flexion                    5/5  5/5   -Sensory exam                           Sensation intact to light touch in L3-S1 nerve distributions of bilateral lower extremities   -Achilles DTR: 2/4 on the left, 2/4 on the right -Patellar tendon DTR: 2/4 on the left, 2/4 on the right   -Straight leg raise: Negative -Contralateral straight leg raise: Negative -Femoral nerve stretch test: Negative bilaterally -Clonus: no beats bilaterally  -TTP with even superficial palpation to the cranial lumbar paraspinal muscles   -Left hip exam: negative FABER and FADIR, negative [place] -Right hip exam: negative FABER and FADIR, negative [place]   -TTP over the lumbar spine midline and paraspinal    Imaging: XR of the thoracic and lumbar spine from 10/02/2022 and 09/03/2022 was previously independently reviewed and interpreted, showing disc height loss at L5/S1.  No evidence of instability on flexion/extension views.  No fracture or dislocation.      Patient name: Bryce Leon Patient MRN: [id number] Date of visit: 03/01/23

## 2023-03-12 ENCOUNTER — Other Ambulatory Visit: Payer: Self-pay | Admitting: Orthopedic Surgery

## 2023-03-12 DIAGNOSIS — M5416 Radiculopathy, lumbar region: Secondary | ICD-10-CM

## 2023-03-12 DIAGNOSIS — Z77018 Contact with and (suspected) exposure to other hazardous metals: Secondary | ICD-10-CM

## 2023-03-26 ENCOUNTER — Other Ambulatory Visit: Payer: Self-pay | Admitting: Orthopedic Surgery

## 2023-03-29 ENCOUNTER — Other Ambulatory Visit: Payer: Medicaid Other

## 2023-03-29 ENCOUNTER — Inpatient Hospital Stay: Admission: RE | Admit: 2023-03-29 | Payer: Medicaid Other | Source: Ambulatory Visit

## 2023-04-05 ENCOUNTER — Ambulatory Visit: Payer: Medicaid Other | Admitting: Orthopedic Surgery

## 2023-04-05 DIAGNOSIS — M545 Low back pain, unspecified: Secondary | ICD-10-CM | POA: Diagnosis not present

## 2023-04-05 DIAGNOSIS — G8929 Other chronic pain: Secondary | ICD-10-CM

## 2023-04-05 NOTE — Progress Notes (Signed)
Orthopedic Spine Surgery Office Note   Assessment: Patient is a 32 y.o. male with history of L5/S1 laminectomy who presents with chronic low back pain and left-sided posterior thigh and leg pain     Plan: -Patient has tried activity modification, physical therapy, Tylenol, Mobic, robaxin -Patient states that his MRI was denied by his insurance company, so I do not think we will be able to get it at this time -Since I cannot get his MRI to continue the workup, I recommended pain management.  Referral was provided to him today -I told him that if he is not doing any better at her next visit, we will try to get an MRI to work this up further -Recommended after starting pain management that he restart desensitization for his low back pain -Patient should return to office in 8 weeks, x-rays at next visit: None     Patient expressed understanding of the plan and all questions were answered to the patient's satisfaction.    ___________________________________________________________________________   History: Patient is a 32 y.o. male who has been previously seen in the office for symptoms of low back pain that radiates into his left buttock and posterior thigh.  Patient states that his pain is worse since I last saw him.  He states that the anti-inflammatories and muscle relaxers are not giving him any significant relief.  He did try desensitization at home but felt it was too painful.  He states that the pain significantly limits his ability to do even household chores.  He states he cannot move stuff around the house because of the pain.  The stuff that he can do takes twice as long because of the pain.  He has not developed any symptoms radiating into the right lower extremity.  Denies bowel/bladder incontinence.  No saddle anesthesia.   Previous treatments: activity modification, tylenol, mobic, physical therapy, robaxin    Physical Exam:   General: no acute distress, appears stated  age Neurologic: alert, answering questions appropriately, following commands Respiratory: unlabored breathing on room air, symmetric chest rise Psychiatric: appropriate affect, normal cadence to speech     MSK (spine):   -Strength exam                                                   Left                  Right EHL                              5/5                  5/5 TA                                 5/5                  5/5 GSC                             5/5                  5/5 Knee extension  5/5                  5/5 Hip flexion                    5/5                  5/5   -Sensory exam                           Sensation intact to light touch in L3-S1 nerve distributions of bilateral lower extremities   -Achilles DTR: 2/4 on the left, 2/4 on the right -Patellar tendon DTR: 2/4 on the left, 2/4 on the right   -Straight leg raise: Negative bilaterally -Clonus: no beats bilaterally   -Tender to light touch over the lumbar spine   Imaging: XR of the thoracic and lumbar spine from 10/02/2022 and 09/03/2022 was previously independently reviewed and interpreted, showing disc height loss at L5/S1.  No evidence of instability on flexion/extension views.  No fracture or dislocation.      Patient name: Bryce Leon Patient MRN: [id number] Date of visit: 04/05/23

## 2023-04-23 ENCOUNTER — Encounter: Payer: Self-pay | Admitting: Orthopedic Surgery

## 2023-04-23 MED ORDER — MELOXICAM 15 MG PO TABS: 15.0000 mg | ORAL_TABLET | Freq: Every day | ORAL | 2 refills | Status: DC

## 2023-04-24 ENCOUNTER — Other Ambulatory Visit: Payer: Self-pay | Admitting: Orthopedic Surgery

## 2023-04-27 ENCOUNTER — Ambulatory Visit
Admission: RE | Admit: 2023-04-27 | Discharge: 2023-04-27 | Disposition: A | Discharge status: Home / Self Care | Payer: Medicaid Other | Source: Ambulatory Visit | Attending: Orthopedic Surgery | Admitting: Orthopedic Surgery

## 2023-04-27 DIAGNOSIS — M5416 Radiculopathy, lumbar region: Secondary | ICD-10-CM

## 2023-04-27 DIAGNOSIS — Z77018 Contact with and (suspected) exposure to other hazardous metals: Secondary | ICD-10-CM

## 2023-05-27 ENCOUNTER — Ambulatory Visit: Payer: Medicaid Other | Admitting: Orthopedic Surgery

## 2023-05-27 DIAGNOSIS — M5416 Radiculopathy, lumbar region: Secondary | ICD-10-CM

## 2023-05-27 MED ORDER — GABAPENTIN 300 MG PO CAPS: 300.0000 mg | ORAL_CAPSULE | Freq: Three times a day (TID) | ORAL | 2 refills | Status: DC

## 2023-05-27 MED ORDER — METHOCARBAMOL 750 MG PO TABS: 750.0000 mg | ORAL_TABLET | Freq: Four times a day (QID) | ORAL | 0 refills | Status: DC | PRN

## 2023-05-27 NOTE — Progress Notes (Signed)
Orthopedic Spine Surgery Office Note   Assessment: Patient is a 32 y.o. male with history of L5/S1 laminectomy who presents with chronic low back pain and bilateral lower extremity pain     Plan: -Patient has tried activity modification, physical therapy, Tylenol, Mobic, robaxin -Prescribed gabapentin and increased his robaxin to try to help with the pain -Can increase his gabapentin at our next visit if he is still having symptoms -Recommended diagnostic/therapeutic L5 injections -Briefly discussed, L5/S1 ALIF and PSIF to indirectly decompress the foramen. 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    ___________________________________________________________________________   History: Patient is a 32 y.o. male who has been previously seen in the office for symptoms of low back pain that radiates into his left buttock and posterior thigh.  Pain has gotten worse since the last time he was seen in the office.  He now has pain in both legs.  He feels it going into the buttock and posterior thigh on the left side.  On the right side he feels it in multiple distributions.  He says it is worse though in the buttock and on the lateral aspect of the thigh.  There is no recent trauma or injury that preceded the onset of this worsening of pain.  He says that his right leg feels like a "giant balloon of soreness." The whole leg hurts. He rates the pain as an 8/10. No bowel or bladder incontinence. No saddle anesthesia.    Previous treatments: activity modification, tylenol, mobic, physical therapy, robaxin     Physical Exam:   General: no acute distress, appears stated  age Neurologic: alert, answering questions appropriately, following commands Respiratory: unlabored breathing on room air, symmetric chest rise Psychiatric: appropriate affect, normal cadence to speech     MSK (spine):   -Strength exam                                                   Left                  Right EHL                              5/5                  5/5 TA                                 5/5                  5/5 GSC                             5/5                  5/5 Knee extension            5/5  5/5 Hip flexion                    5/5                  5/5   -Sensory exam                           Sensation intact to light touch in L3-S1 nerve distributions of bilateral lower extremities   -Achilles DTR: 2/4 on the left, 2/4 on the right -Patellar tendon DTR: 2/4 on the left, 2/4 on the right   -Straight leg raise: Negative bilaterally -Clonus: no beats bilaterally    Imaging: XR of the lumbar spine from 10/02/2022 was previously independently reviewed and interpreted, showing disc height loss at L5/S1.  No evidence of instability on flexion/extension views.  No fracture or dislocation.    MRI of the lumbar spine from 04/27/2023 was independently interpreted, showing disc desiccation at L4/5.  DDD at L5/S1 bilateral foraminal stenosis at L5/S1.  Disc herniation at L5/S1.  No other significant stenosis seen.  Laminectomy defect at L5/S1.   Patient name: Bryce Leon Patient MRN: [id number] Date of visit: 05/27/23

## 2023-06-10 ENCOUNTER — Other Ambulatory Visit: Payer: Self-pay

## 2023-06-10 ENCOUNTER — Ambulatory Visit: Payer: Medicaid Other | Admitting: Physical Medicine and Rehabilitation

## 2023-06-10 VITALS — BP 126/85 | HR 95

## 2023-06-10 DIAGNOSIS — M5416 Radiculopathy, lumbar region: Secondary | ICD-10-CM

## 2023-06-10 MED ORDER — METHYLPREDNISOLONE ACETATE 40 MG/ML IJ SUSP
40.0000 mg | Freq: Once | INTRAMUSCULAR | Status: AC
Start: 2023-06-10 — End: 2023-06-10
  Administered 2023-06-10: 40 mg

## 2023-06-10 NOTE — Progress Notes (Signed)
Functional Pain Scale - descriptive words and definitions  Distracting (5)    Aware of pain/able to complete some ADL's but limited by pain/sleep is affected and active distractions are only slightly useful. Moderate range order  Average Pain 5-6   +Driver, -BT, -Dye Allergies.  Lower back pain on both sides that radiates in both legs to the feet. Has numbing and tingling in both legs

## 2023-06-10 NOTE — Patient Instructions (Signed)

## 2023-06-24 ENCOUNTER — Encounter: Payer: Self-pay | Admitting: Orthopedic Surgery

## 2023-06-27 NOTE — Procedures (Signed)
Lumbosacral Transforaminal Epidural Steroid Injection - Sub-Pedicular Approach with Fluoroscopic Guidance  Patient: Bryce Leon      Date of Birth: 08/12/1991 MRN: [id number] PCP: Pcp, No      Visit Date: 06/10/2023   Universal Protocol:    Date/Time: 06/10/2023  Consent Given By: the patient  Position: PRONE  Additional Comments: Vital signs were monitored before and after the procedure. Patient was prepped and draped in the usual sterile fashion. The correct patient, procedure, and site was verified.   Injection Procedure Details:   Procedure diagnoses: Lumbar radiculopathy [M54.16]    Meds Administered:  Meds ordered this encounter  Medications   methylPREDNISolone acetate (DEPO-MEDROL) injection 40 mg    Laterality: Bilateral  Location/Site: L5  Needle:5.0 in., 22 ga.  Short bevel or Quincke spinal needle  Needle Placement: Transforaminal  Findings:    -Comments: Excellent flow of contrast along the nerve, nerve root and into the epidural space.  Procedure Details: After squaring off the end-plates to get a true AP view, the C-arm was positioned so that an oblique view of the foramen as noted above was visualized. The target area is just inferior to the "nose of the scotty dog" or sub pedicular. The soft tissues overlying this structure were infiltrated with 2-3 ml. of 1% Lidocaine without Epinephrine.  The spinal needle was inserted toward the target using a "trajectory" view along the fluoroscope beam.  Under AP and lateral visualization, the needle was advanced so it did not puncture dura and was located close the 6 O'Clock position of the pedical in AP tracterory. Biplanar projections were used to confirm position. Aspiration was confirmed to be negative for CSF and/or blood. A 1-2 ml. volume of Isovue-250 was injected and flow of contrast was noted at each level. Radiographs were obtained for documentation purposes.   After attaining the desired flow of  contrast documented above, a 0.5 to 1.0 ml test dose of 0.25% Marcaine was injected into each respective transforaminal space.  The patient was observed for 90 seconds post injection.  After no sensory deficits were reported, and normal lower extremity motor function was noted,   the above injectate was administered so that equal amounts of the injectate were placed at each foramen (level) into the transforaminal epidural space.   Additional Comments:  No complications occurred Dressing: 2 x 2 sterile gauze and Band-Aid    Post-procedure details: Patient was observed during the procedure. Post-procedure instructions were reviewed.  Patient left the clinic in stable condition.

## 2023-06-27 NOTE — Progress Notes (Signed)
Bryce Leon - 32 y.o. male MRN [id number]  Date of birth: 1990/12/13  Office Visit Note: Visit Date: 06/10/2023 PCP: Pcp, No Referred by: London Sheer, MD  Subjective: Chief Complaint  Patient presents with   Lower Back - Pain   HPI:  Bryce Leon is a 32 y.o. male who comes in today at the request of Dr. Willia Craze for planned Bilateral L5-S1 Lumbar Transforaminal epidural steroid injection with fluoroscopic guidance.  The patient has failed conservative care including home exercise, medications, time and activity modification.  This injection will be diagnostic and hopefully therapeutic.  Please see requesting physician notes for further details and justification.   ROS Otherwise per HPI.  Assessment & Plan: Visit Diagnoses:    ICD-10-CM   1. Lumbar radiculopathy  M54.16 XR C-ARM NO REPORT    Epidural Steroid injection    methylPREDNISolone acetate (DEPO-MEDROL) injection 40 mg      Plan: No additional findings.   Meds & Orders:  Meds ordered this encounter  Medications   methylPREDNISolone acetate (DEPO-MEDROL) injection 40 mg    Orders Placed This Encounter  Procedures   XR C-ARM NO REPORT   Epidural Steroid injection    Follow-up: Return for visit to requesting provider as needed.   Procedures: No procedures performed  Lumbosacral Transforaminal Epidural Steroid Injection - Sub-Pedicular Approach with Fluoroscopic Guidance  Patient: Bryce Leon      Date of Birth: 02/29/1991 MRN: [id number] PCP: Pcp, No      Visit Date: 06/10/2023   Universal Protocol:    Date/Time: 06/10/2023  Consent Given By: the patient  Position: PRONE  Additional Comments: Vital signs were monitored before and after the procedure. Patient was prepped and draped in the usual sterile fashion. The correct patient, procedure, and site was verified.   Injection Procedure Details:   Procedure diagnoses: Lumbar radiculopathy [M54.16]    Meds Administered:  Meds  ordered this encounter  Medications   methylPREDNISolone acetate (DEPO-MEDROL) injection 40 mg    Laterality: Bilateral  Location/Site: L5  Needle:5.0 in., 22 ga.  Short bevel or Quincke spinal needle  Needle Placement: Transforaminal  Findings:    -Comments: Excellent flow of contrast along the nerve, nerve root and into the epidural space.  Procedure Details: After squaring off the end-plates to get a true AP view, the C-arm was positioned so that an oblique view of the foramen as noted above was visualized. The target area is just inferior to the "nose of the scotty dog" or sub pedicular. The soft tissues overlying this structure were infiltrated with 2-3 ml. of 1% Lidocaine without Epinephrine.  The spinal needle was inserted toward the target using a "trajectory" view along the fluoroscope beam.  Under AP and lateral visualization, the needle was advanced so it did not puncture dura and was located close the 6 O'Clock position of the pedical in AP tracterory. Biplanar projections were used to confirm position. Aspiration was confirmed to be negative for CSF and/or blood. A 1-2 ml. volume of Isovue-250 was injected and flow of contrast was noted at each level. Radiographs were obtained for documentation purposes.   After attaining the desired flow of contrast documented above, a 0.5 to 1.0 ml test dose of 0.25% Marcaine was injected into each respective transforaminal space.  The patient was observed for 90 seconds post injection.  After no sensory deficits were reported, and normal lower extremity motor function was noted,   the above injectate was administered so that  equal amounts of the injectate were placed at each foramen (level) into the transforaminal epidural space.   Additional Comments:  No complications occurred Dressing: 2 x 2 sterile gauze and Band-Aid    Post-procedure details: Patient was observed during the procedure. Post-procedure instructions were  reviewed.  Patient left the clinic in stable condition.    Clinical History: MRI LUMBAR SPINE WITHOUT CONTRAST   TECHNIQUE: Multiplanar, multisequence MR imaging of the lumbar spine was performed. No intravenous contrast was administered.   COMPARISON:  X-ray 04/22/2023, 09/03/2022   FINDINGS: Segmentation:  Standard.   Alignment:  Trace retrolisthesis L5-S1.   Vertebrae: No fracture, evidence of discitis, or bone lesion. Mild discogenic endplate changes at L5-S1.   Conus medullaris and cauda equina: Conus extends to the L1 level. Conus and cauda equina appear normal.   Paraspinal and other soft tissues: Negative.   Disc levels:   T12-L1 through L3-L4: No significant disc protrusion, foraminal stenosis, or canal stenosis.   L4-L5: Disc desiccation with broad-based central disc protrusion. Mild bilateral facet hypertrophy. Mild bilateral subarticular recess stenosis without canal stenosis. Mild bilateral foraminal stenosis.   L5-S1: Disc desiccation and height loss with diffuse disc bulge. Mild bilateral facet arthropathy. No canal stenosis. Severe bilateral foraminal stenosis.   IMPRESSION: 1. 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 2. No canal stenosis at any level.     Electronically Signed   By: Duanne Guess D.O.   On: 05/13/2023 10:48     Objective:  VS:  HT:    WT:   BMI:     BP:126/85  HR:95bpm  TEMP: ( )  RESP:  Physical Exam Vitals and nursing note reviewed.  Constitutional:      General: He is not in acute distress.    Appearance: Normal appearance. He is not ill-appearing.  HENT:     Head: Normocephalic and atraumatic.     Right Ear: External ear normal.     Left Ear: External ear normal.     Nose: No congestion.  Eyes:     Extraocular Movements: Extraocular movements intact.  Cardiovascular:     Rate and Rhythm: Normal  rate.     Pulses: Normal pulses.  Pulmonary:     Effort: Pulmonary effort is normal. No respiratory distress.  Abdominal:     General: There is no distension.     Palpations: Abdomen is soft.  Musculoskeletal:        General: No tenderness or signs of injury.     Cervical back: Neck supple.     Right lower leg: No edema.     Left lower leg: No edema.     Comments: Patient has good distal strength without clonus.  Skin:    Findings: No erythema or rash.  Neurological:     General: No focal deficit present.     Mental Status: He is alert and oriented to person, place, and time.     Sensory: No sensory deficit.     Motor: No weakness or abnormal muscle tone.     Coordination: Coordination normal.  Psychiatric:        Mood and Affect: Mood normal.        Behavior: Behavior normal.      Imaging: No results found.

## 2023-07-08 ENCOUNTER — Encounter: Payer: Self-pay | Admitting: Orthopedic Surgery

## 2023-07-08 ENCOUNTER — Ambulatory Visit: Payer: Medicaid Other | Admitting: Orthopedic Surgery

## 2023-07-08 VITALS — Wt 247.8 lb

## 2023-07-08 DIAGNOSIS — M5416 Radiculopathy, lumbar region: Secondary | ICD-10-CM

## 2023-07-08 NOTE — Progress Notes (Signed)
Orthopedic Spine Surgery Office Note   Assessment: Patient is a 32 y.o. male with history of L5/S1 laminectomy who presents with chronic low back pain and bilateral lower extremity pain     Plan: -Patient has tried activity modification, physical therapy, Tylenol, Mobic, robaxin -Instructed him on how to gradually increase his gabapentin to 600mg  TID -He got 70% relief with injections and said he has not felt that good since he was 17, so he wanted to try a repeat injection. 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    ___________________________________________________________________________   History: Patient is a 32 y.o. male who has been previously seen in the office for symptoms of low back pain that radiates into his left buttock and posterior thigh. He is still having pain in his low back that goes into bilateral lower extremities.  On the left, he feels it in the buttock and posterior thigh.  On the right, he feels it in multiple distributions.  The right is worse though in the buttock and lateral aspect of the thigh.  After last visit, he got an injection with Dr. Alvester Morin to L5.  He states he felt great and got 70% relief that.  He said he has not felt good since he was 32 years old.  Pain though eventually returned and was similar to how it was before the injection.  Previous treatments: activity modification, tylenol, mobic, physical therapy, robaxin     Physical Exam:   General: no acute distress, appears stated age Neurologic: alert, answering questions appropriately,  following commands Respiratory: unlabored breathing on room air, symmetric chest rise Psychiatric: appropriate affect, normal cadence to speech     MSK (spine):   -Strength exam                                                   Left                  Right EHL                              5/5                  5/5 TA                                 5/5                  5/5 GSC                             5/5                  5/5 Knee extension            5/5  5/5 Hip flexion                    5/5                  5/5   -Sensory exam                           Sensation intact to light touch in L3-S1 nerve distributions of bilateral lower extremities   -Achilles DTR: 2/4 on the left, 2/4 on the right -Patellar tendon DTR: 2/4 on the left, 2/4 on the right   -Straight leg raise: Negative bilaterally -Clonus: no beats bilaterally     Imaging: XR of the lumbar spine from 10/02/2022 was previously independently reviewed and interpreted, showing disc height loss at L5/S1.  No evidence of instability on flexion/extension views.  No fracture or dislocation.    MRI of the lumbar spine from 04/27/2023 was previously independently interpreted, showing disc desiccation at L4/5.  DDD at L5/S1 bilateral foraminal stenosis at L5/S1.  Disc herniation at L5/S1.  No other significant stenosis seen.  Laminectomy defect at L5/S1.     Patient name: Bryce Leon Patient MRN: [id number] Date of visit: 07/08/23

## 2023-07-26 ENCOUNTER — Other Ambulatory Visit: Payer: Self-pay | Admitting: Orthopedic Surgery

## 2023-07-29 ENCOUNTER — Other Ambulatory Visit: Payer: Self-pay | Admitting: Orthopedic Surgery

## 2023-08-16 ENCOUNTER — Telehealth: Payer: Self-pay | Admitting: Physical Medicine and Rehabilitation

## 2023-08-16 NOTE — Telephone Encounter (Signed)
Patient called and wants to make an appointment for booster shots in his back. ZO#[phone]

## 2023-08-26 ENCOUNTER — Ambulatory Visit: Payer: Medicaid Other | Admitting: Physical Medicine and Rehabilitation

## 2023-08-26 ENCOUNTER — Other Ambulatory Visit: Payer: Self-pay

## 2023-08-26 DIAGNOSIS — M5416 Radiculopathy, lumbar region: Secondary | ICD-10-CM

## 2023-08-26 MED ORDER — METHYLPREDNISOLONE ACETATE 40 MG/ML IJ SUSP
40.0000 mg | Freq: Once | INTRAMUSCULAR | Status: AC
Start: 2023-08-26 — End: 2023-08-26
  Administered 2023-08-26: 40 mg

## 2023-08-26 NOTE — Procedures (Signed)
Lumbosacral Transforaminal Epidural Steroid Injection - Sub-Pedicular Approach with Fluoroscopic Guidance  Patient: Bryce Leon      Date of Birth: 07-12-1991 MRN: [id number] PCP: Pcp, No      Visit Date: 08/26/2023   Universal Protocol:    Date/Time: 08/26/2023  Consent Given By: the patient  Position: PRONE  Additional Comments: Vital signs were monitored before and after the procedure. Patient was prepped and draped in the usual sterile fashion. The correct patient, procedure, and site was verified.   Injection Procedure Details:   Procedure diagnoses: Lumbar radiculopathy [M54.16]    Meds Administered:  Meds ordered this encounter  Medications   methylPREDNISolone acetate (DEPO-MEDROL) injection 40 mg    Laterality: Bilateral  Location/Site: L5  Needle:6.0 in., 22 ga.  Short bevel or Quincke spinal needle  Needle Placement: Transforaminal  Findings:    -Comments: Excellent flow of contrast along the nerve, nerve root and into the epidural space.  Procedure Details: After squaring off the end-plates to get a true AP view, the C-arm was positioned so that an oblique view of the foramen as noted above was visualized. The target area is just inferior to the "nose of the scotty dog" or sub pedicular. The soft tissues overlying this structure were infiltrated with 2-3 ml. of 1% Lidocaine without Epinephrine.  The spinal needle was inserted toward the target using a "trajectory" view along the fluoroscope beam.  Under AP and lateral visualization, the needle was advanced so it did not puncture dura and was located close the 6 O'Clock position of the pedical in AP tracterory. Biplanar projections were used to confirm position. Aspiration was confirmed to be negative for CSF and/or blood. A 1-2 ml. volume of Isovue-250 was injected and flow of contrast was noted at each level. Radiographs were obtained for documentation purposes.   After attaining the desired flow of  contrast documented above, a 0.5 to 1.0 ml test dose of 0.25% Marcaine was injected into each respective transforaminal space.  The patient was observed for 90 seconds post injection.  After no sensory deficits were reported, and normal lower extremity motor function was noted,   the above injectate was administered so that equal amounts of the injectate were placed at each foramen (level) into the transforaminal epidural space.   Additional Comments:  The patient tolerated the procedure well Dressing: 2 x 2 sterile gauze and Band-Aid    Post-procedure details: Patient was observed during the procedure. Post-procedure instructions were reviewed.  Patient left the clinic in stable condition.

## 2023-08-26 NOTE — Progress Notes (Signed)
Functional Pain Scale - descriptive words and definitions  Moderate (4)   Constantly aware of pain, can complete ADLs with modification/sleep marginally affected at times/passive distraction is of no use, but active distraction gives some relief. Moderate range order  Average Pain 4 129/83 L > R   +Driver, -BT, -Dye Allergies.

## 2023-08-26 NOTE — Progress Notes (Signed)
Bryce Leon - 32 y.o. male MRN [id number]  Date of birth: 07-Jan-1991  Office Visit Note: Visit Date: 08/26/2023 PCP: Pcp, No Referred by: London Sheer, MD  Subjective: Chief Complaint  Patient presents with   Lower Back - Pain   HPI:  Bryce Leon is a 32 y.o. male who comes in today for planned repeat Bilateral L5-S1  Lumbar Transforaminal epidural steroid injection with fluoroscopic guidance.  The patient has failed conservative care including home exercise, medications, time and activity modification.  This injection will be diagnostic and hopefully therapeutic.  Please see requesting physician notes for further details and justification. Patient received more than 50% pain relief from prior injection.   Referring: Dr. Willia Craze   ROS Otherwise per HPI.  Assessment & Plan: Visit Diagnoses:    ICD-10-CM   1. Lumbar radiculopathy  M54.16 XR C-ARM NO REPORT    Epidural Steroid injection    methylPREDNISolone acetate (DEPO-MEDROL) injection 40 mg      Plan: No additional findings.   Meds & Orders:  Meds ordered this encounter  Medications   methylPREDNISolone acetate (DEPO-MEDROL) injection 40 mg    Orders Placed This Encounter  Procedures   XR C-ARM NO REPORT   Epidural Steroid injection    Follow-up: Return for visit to requesting provider as needed.   Procedures: No procedures performed  Lumbosacral Transforaminal Epidural Steroid Injection - Sub-Pedicular Approach with Fluoroscopic Guidance  Patient: Bryce Leon      Date of Birth: [birth year] MRN: [id number] PCP: Pcp, No      Visit Date: 08/26/2023   Universal Protocol:    Date/Time: 08/26/2023  Consent Given By: the patient  Position: PRONE  Additional Comments: Vital signs were monitored before and after the procedure. Patient was prepped and draped in the usual sterile fashion. The correct patient, procedure, and site was verified.   Injection Procedure Details:   Procedure  diagnoses: Lumbar radiculopathy [M54.16]    Meds Administered:  Meds ordered this encounter  Medications   methylPREDNISolone acetate (DEPO-MEDROL) injection 40 mg    Laterality: Bilateral  Location/Site: L5  Needle:6.0 in., 22 ga.  Short bevel or Quincke spinal needle  Needle Placement: Transforaminal  Findings:    -Comments: Excellent flow of contrast along the nerve, nerve root and into the epidural space.  Procedure Details: After squaring off the end-plates to get a true AP view, the C-arm was positioned so that an oblique view of the foramen as noted above was visualized. The target area is just inferior to the "nose of the scotty dog" or sub pedicular. The soft tissues overlying this structure were infiltrated with 2-3 ml. of 1% Lidocaine without Epinephrine.  The spinal needle was inserted toward the target using a "trajectory" view along the fluoroscope beam.  Under AP and lateral visualization, the needle was advanced so it did not puncture dura and was located close the 6 O'Clock position of the pedical in AP tracterory. Biplanar projections were used to confirm position. Aspiration was confirmed to be negative for CSF and/or blood. A 1-2 ml. volume of Isovue-250 was injected and flow of contrast was noted at each level. Radiographs were obtained for documentation purposes.   After attaining the desired flow of contrast documented above, a 0.5 to 1.0 ml test dose of 0.25% Marcaine was injected into each respective transforaminal space.  The patient was observed for 90 seconds post injection.  After no sensory deficits were reported, and normal lower extremity motor function  was noted,   the above injectate was administered so that equal amounts of the injectate were placed at each foramen (level) into the transforaminal epidural space.   Additional Comments:  The patient tolerated the procedure well Dressing: 2 x 2 sterile gauze and Band-Aid    Post-procedure  details: Patient was observed during the procedure. Post-procedure instructions were reviewed.  Patient left the clinic in stable condition.    Clinical History: MRI LUMBAR SPINE WITHOUT CONTRAST   TECHNIQUE: Multiplanar, multisequence MR imaging of the lumbar spine was performed. No intravenous contrast was administered.   COMPARISON:  X-ray 04/22/2023, 09/03/2022   FINDINGS: Segmentation:  Standard.   Alignment:  Trace retrolisthesis L5-S1.   Vertebrae: No fracture, evidence of discitis, or bone lesion. Mild discogenic endplate changes at L5-S1.   Conus medullaris and cauda equina: Conus extends to the L1 level. Conus and cauda equina appear normal.   Paraspinal and other soft tissues: Negative.   Disc levels:   T12-L1 through L3-L4: No significant disc protrusion, foraminal stenosis, or canal stenosis.   L4-L5: Disc desiccation with broad-based central disc protrusion. Mild bilateral facet hypertrophy. Mild bilateral subarticular recess stenosis without canal stenosis. Mild bilateral foraminal stenosis.   L5-S1: Disc desiccation and height loss with diffuse disc bulge. Mild bilateral facet arthropathy. No canal stenosis. Severe bilateral foraminal stenosis.   IMPRESSION: 1. 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 2. No canal stenosis at any level.     Electronically Signed   By: Duanne Guess D.O.   On: 05/13/2023 10:48     Objective:  VS:  HT:    WT:   BMI:     BP:   HR: bpm  TEMP: ( )  RESP:  Physical Exam Vitals and nursing note reviewed.  Constitutional:      General: He is not in acute distress.    Appearance: Normal appearance. He is not ill-appearing.  HENT:     Head: Normocephalic and atraumatic.     Right Ear: External ear normal.     Left Ear: External ear normal.     Nose: No congestion.  Eyes:     Extraocular  Movements: Extraocular movements intact.  Cardiovascular:     Rate and Rhythm: Normal rate.     Pulses: Normal pulses.  Pulmonary:     Effort: Pulmonary effort is normal. No respiratory distress.  Abdominal:     General: There is no distension.     Palpations: Abdomen is soft.  Musculoskeletal:        General: No tenderness or signs of injury.     Cervical back: Neck supple.     Right lower leg: No edema.     Left lower leg: No edema.     Comments: Patient has good distal strength without clonus.  Skin:    Findings: No erythema or rash.  Neurological:     General: No focal deficit present.     Mental Status: He is alert and oriented to person, place, and time.     Sensory: No sensory deficit.     Motor: No weakness or abnormal muscle tone.     Coordination: Coordination normal.  Psychiatric:        Mood and Affect: Mood normal.        Behavior: Behavior normal.      Imaging: XR C-ARM NO REPORT Result Date: 08/26/2023 Please see Notes tab for  imaging impression.

## 2023-08-26 NOTE — Patient Instructions (Signed)

## 2023-09-20 ENCOUNTER — Other Ambulatory Visit: Payer: Self-pay | Admitting: Orthopedic Surgery

## 2023-09-27 DIAGNOSIS — M9904 Segmental and somatic dysfunction of sacral region: Secondary | ICD-10-CM | POA: Diagnosis not present

## 2023-09-27 DIAGNOSIS — M5386 Other specified dorsopathies, lumbar region: Secondary | ICD-10-CM | POA: Diagnosis not present

## 2023-09-27 DIAGNOSIS — M9905 Segmental and somatic dysfunction of pelvic region: Secondary | ICD-10-CM | POA: Diagnosis not present

## 2023-09-27 DIAGNOSIS — M9903 Segmental and somatic dysfunction of lumbar region: Secondary | ICD-10-CM | POA: Diagnosis not present

## 2023-09-29 DIAGNOSIS — M9904 Segmental and somatic dysfunction of sacral region: Secondary | ICD-10-CM | POA: Diagnosis not present

## 2023-09-29 DIAGNOSIS — M5386 Other specified dorsopathies, lumbar region: Secondary | ICD-10-CM | POA: Diagnosis not present

## 2023-09-29 DIAGNOSIS — M9903 Segmental and somatic dysfunction of lumbar region: Secondary | ICD-10-CM | POA: Diagnosis not present

## 2023-09-29 DIAGNOSIS — M9905 Segmental and somatic dysfunction of pelvic region: Secondary | ICD-10-CM | POA: Diagnosis not present

## 2023-10-04 ENCOUNTER — Encounter: Payer: Self-pay | Admitting: Orthopedic Surgery

## 2023-10-04 ENCOUNTER — Ambulatory Visit: Payer: Medicaid Other | Admitting: Orthopedic Surgery

## 2023-10-04 DIAGNOSIS — M9904 Segmental and somatic dysfunction of sacral region: Secondary | ICD-10-CM | POA: Diagnosis not present

## 2023-10-04 DIAGNOSIS — M5386 Other specified dorsopathies, lumbar region: Secondary | ICD-10-CM | POA: Diagnosis not present

## 2023-10-04 DIAGNOSIS — M9903 Segmental and somatic dysfunction of lumbar region: Secondary | ICD-10-CM | POA: Diagnosis not present

## 2023-10-04 DIAGNOSIS — M9905 Segmental and somatic dysfunction of pelvic region: Secondary | ICD-10-CM | POA: Diagnosis not present

## 2023-10-07 DIAGNOSIS — M9904 Segmental and somatic dysfunction of sacral region: Secondary | ICD-10-CM | POA: Diagnosis not present

## 2023-10-07 DIAGNOSIS — M9905 Segmental and somatic dysfunction of pelvic region: Secondary | ICD-10-CM | POA: Diagnosis not present

## 2023-10-07 DIAGNOSIS — M9903 Segmental and somatic dysfunction of lumbar region: Secondary | ICD-10-CM | POA: Diagnosis not present

## 2023-10-07 DIAGNOSIS — M5386 Other specified dorsopathies, lumbar region: Secondary | ICD-10-CM | POA: Diagnosis not present

## 2023-10-18 ENCOUNTER — Other Ambulatory Visit: Payer: Self-pay | Admitting: Orthopedic Surgery

## 2023-10-21 ENCOUNTER — Other Ambulatory Visit: Payer: Self-pay | Admitting: Orthopedic Surgery

## 2024-01-07 ENCOUNTER — Other Ambulatory Visit: Payer: Self-pay | Admitting: Orthopedic Surgery

## 2024-01-10 MED ORDER — METHOCARBAMOL 750 MG PO TABS: 750.0000 mg | ORAL_TABLET | Freq: Four times a day (QID) | ORAL | 0 refills | Status: AC | PRN

## 2024-01-10 MED ORDER — MELOXICAM 15 MG PO TABS: 15.0000 mg | ORAL_TABLET | Freq: Every day | ORAL | 0 refills | Status: AC

## 2024-01-10 MED ORDER — GABAPENTIN 300 MG PO CAPS: 300.0000 mg | ORAL_CAPSULE | Freq: Three times a day (TID) | ORAL | 0 refills | Status: AC

## 2024-01-13 ENCOUNTER — Ambulatory Visit (HOSPITAL_COMMUNITY)
Admission: EM | Admit: 2024-01-13 | Discharge: 2024-01-13 | Disposition: A | Discharge status: Home / Self Care | Payer: MEDICAID | Attending: Behavioral Health | Admitting: Behavioral Health

## 2024-01-13 DIAGNOSIS — F319 Bipolar disorder, unspecified: Secondary | ICD-10-CM

## 2024-01-13 DIAGNOSIS — Z79899 Other long term (current) drug therapy: Secondary | ICD-10-CM | POA: Insufficient documentation

## 2024-01-13 DIAGNOSIS — Z9151 Personal history of suicidal behavior: Secondary | ICD-10-CM | POA: Insufficient documentation

## 2024-01-13 DIAGNOSIS — F1290 Cannabis use, unspecified, uncomplicated: Secondary | ICD-10-CM | POA: Insufficient documentation

## 2024-01-13 DIAGNOSIS — F3170 Bipolar disorder, currently in remission, most recent episode unspecified: Secondary | ICD-10-CM | POA: Insufficient documentation

## 2024-01-13 DIAGNOSIS — Z760 Encounter for issue of repeat prescription: Secondary | ICD-10-CM | POA: Insufficient documentation

## 2024-01-13 MED ORDER — HYDROXYZINE PAMOATE 50 MG PO CAPS: 50.0000 mg | ORAL_CAPSULE | Freq: Every evening | ORAL | 0 refills | Status: DC | PRN

## 2024-01-13 MED ORDER — HYDROXYZINE PAMOATE 50 MG PO CAPS: 50.0000 mg | ORAL_CAPSULE | Freq: Every evening | ORAL | 0 refills | Status: AC | PRN

## 2024-01-13 NOTE — ED Provider Notes (Signed)
 Behavioral Health Urgent Care Medical Screening Exam  Patient Name: Bryce Leon MRN: [id number] Date of Evaluation: 01/13/24  Chief Complaint:  Per Triage, "Bryce Leon "Bryce Leon" Koker presents to [facility] voluntarily unaccompanied. Pt states that he needs an emergency medication refill on his Seroquel . Pt states that he took his last dosage last night. Pt states that he had a falling out with his previous psychiatrist and that he is in search of getting establish with someone new. Pt currently denies SI, HI, AVH and alcohol use. Pt states that he smoked marijuana/hemp flower this morning."  Diagnosis:  Final diagnoses:  Bipolar affective disorder, remission status unspecified (HCC)  Encounter for medication refill    History of Present illness: Bryce Leon is a 33 y.o. male patient with a documented history of bipolar disorder and psychosis unspecified who presented to the [facility] voluntarily requesting a medication refill for Seroquel .  Patient states that he ran out of his Seroquel  a couple months ago when his prescription ran out and he has been using his girlfriend's mother Seroquel . He is states that he is prescribed Seroquel  300 mg at bedtime but takes 400 mg at bedtime. He states that he was seeing Dr. Luster Salters at [facility] in [place], [place]  through telehealth but because of an inappropriate conversation at his last appointment he hung up on him. He reports that he is also prescribed Depakote  1,000 mg at bedtime, sertraline  100 mg once a day, BuSpar 15 mg 3 times a day, Klonopin 0.5 mg as needed for panic attacks and hydroxyzine  as needed. He states that he has been off his medications for a couple months. He states that he took the last Seroquel  that he had last night and he is worried that he may not be able to sleep tonight. He reports that he sleeps well with the Seroquel  and asked if there are any other medications that he can take to  help with sleep. I discussed with the patient taking hydroxyzine  50 mg at bedtime as needed for sleep, considering he was previously prescribed hydroxyzine  and the risk for side effects are low.  On evaluation, patient is alert and oriented x 4. His thought processes is linear and goal oriented. Thought content is negative for SI/HI/AVH. Objectively, there is no evidence of acute psychosis. His speech is clear and coherent at a moderate tone. His mood is anxious and affect is congruent. Patient is cooperative on exam and does not appear to be in acute distress.  He denies drinking alcohol. He reports smoking "hemp flower" marijuana. He lives in a camper here in [place] [facility] [place] [facility] on his girlfriend's family property. He does not have outpatient psychiatry or counseling at this time.  He reports multiple past inpatient psychiatric hospitalizations at [facility] His last reported hospitalization was 3 years ago for psychosis. He reports 1 past suicide attempt 4 or 5 years ago by carbon monoxide.   Plan of care: Patient offered overnight observation to restart home psychotropic medications. Patient declined and states that he doesn't like staying in facilities like this. Will prescribed vistaril  50 mg po at bedtime prn for sleep/anxiety. Patient to follow up here at the [facility] open access to establish outpatient services for medication management. Safety planning completed at the time of discharge.   Flowsheet Row ED from 01/13/2024 in [facility] ED from 01/27/2021 in [facility] Emergency Department at [facility] ED from 01/17/2021 in [facility] at [facility]  C-SSRS RISK CATEGORY No Risk Moderate Risk High Risk       Psychiatric Specialty Exam  Presentation  General Appearance:Appropriate for Environment  Eye Contact:Fair  Speech:Clear and Coherent  Speech  Volume:Normal  Handedness:Right   Mood and Affect  Mood: Anxious  Affect: Congruent   Thought Process  Thought Processes: Coherent  Descriptions of Associations:Intact  Orientation:Full (Time, Place and Person)  Thought Content:Logical  Diagnosis of Schizophrenia or Schizoaffective disorder in past: No data recorded Duration of Psychotic Symptoms: No data recorded Hallucinations:None  Ideas of Reference:None  Suicidal Thoughts:No  Homicidal Thoughts:No   Sensorium  Memory: Immediate Fair; Recent Fair; Remote Fair  Judgment: Fair  Insight: Fair   Art therapist  Concentration: Fair  Attention Span: Fair  Recall: [employer] of Knowledge: Fair  Language: Fair   Psychomotor Activity  Psychomotor Activity: Normal   Assets  Assets: Manufacturing systems engineer; Desire for Improvement; Housing; Leisure Time; Physical Health   Sleep  Sleep: Fair  Physical Exam: Physical Exam Cardiovascular:     Rate and Rhythm: Normal rate.  Pulmonary:     Effort: Pulmonary effort is normal.  Musculoskeletal:        General: Normal range of motion.     Cervical back: Normal range of motion.  Neurological:     Mental Status: He is alert and oriented to person, place, and time.    Review of Systems  Constitutional: Negative.   HENT: Negative.    Eyes: Negative.   Respiratory: Negative.    Cardiovascular: Negative.   Gastrointestinal: Negative.   Genitourinary: Negative.   Musculoskeletal: Negative.    Blood pressure (!) 133/90, pulse 86, temperature 97.8 F (36.6 C), temperature source Oral, resp. rate 17, SpO2 99%. There is no height or weight on file to calculate BMI.  Musculoskeletal: Strength & Muscle Tone: within normal limits Gait & Station: normal Patient leans: N/A   [facility] MSE Discharge Disposition for Follow up and Recommendations: Based on my evaluation the patient does not appear to have an emergency medical condition and can be  discharged with resources and follow up care in outpatient services for Medication Management  Discharge recommendations:   Medications: Patient is to take medications as prescribed. The patient or patient's guardian is to contact a medical professional and/or outpatient provider to address any new side effects that develop. The patient or the patient's guardian should update outpatient providers of any new medications and/or medication changes.   -Will prescribe vistaril  50 mg po at bedtime prn for anxiety/sleep x 7 days.   Outpatient Follow up: Please review list of outpatient resources for psychiatry and counseling. Please follow up with your primary care provider for all medical related needs.   You are encouraged to follow up with [facility] for outpatient treatment.  Walk in/ Open Access Hours: Monday - Friday 8AM - 11AM (please arrive at 7:00 AM).  [facility] [address] [place], [place] [phone] Safety:   The following safety precautions should be taken:   No sharp objects. This includes scissors, razors, scrapers, and putty knives.   Chemicals should be removed and locked up.   Medications should be removed and locked up.   Weapons should be removed and locked up. This includes firearms, knives and instruments that can be used to cause injury.   The patient should abstain from use of illicit substances/drugs and abuse of any medications.  If symptoms worsen or do  not continue to improve or if the patient becomes actively suicidal or homicidal then it is recommended that the patient return to the closest hospital emergency department, the [facility], or call 911 for further evaluation and treatment. National Suicide Prevention Lifeline 1-800-SUICIDE or 930-844-1872.  About 988 988 offers 24/7 access to trained crisis counselors who can help people experiencing mental health-related distress.  People can call or text 988 or chat 988lifeline.org for themselves or if they are worried about a loved one who may need crisis support.    Syretta Kochel L, NP 01/13/2024, 2:25 PM

## 2024-01-13 NOTE — Discharge Instructions (Addendum)
 Discharge recommendations:   Medications: Patient is to take medications as prescribed. The patient or patient's guardian is to contact a medical professional and/or outpatient provider to address any new side effects that develop. The patient or the patient's guardian should update outpatient providers of any new medications and/or medication changes.   Outpatient Follow up: Please review list of outpatient resources for psychiatry and counseling. Please follow up with your primary care provider for all medical related needs.   You are encouraged to follow up with [facility] for outpatient treatment.  Walk in/ Open Access Hours: Monday - Friday 8AM - 11AM (please arrive at 7:00 AM  [facility] [address] [place], [place] [phone] Safety:   The following safety precautions should be taken:   No sharp objects. This includes scissors, razors, scrapers, and putty knives.   Chemicals should be removed and locked up.   Medications should be removed and locked up.   Weapons should be removed and locked up. This includes firearms, knives and instruments that can be used to cause injury.   The patient should abstain from use of illicit substances/drugs and abuse of any medications.  If symptoms worsen or do not continue to improve or if the patient becomes actively suicidal or homicidal then it is recommended that the patient return to the closest hospital emergency department, the [facility], or call 911 for further evaluation and treatment. National Suicide Prevention Lifeline 1-800-SUICIDE or (825) 865-7622.  About 988 988 offers 24/7 access to trained crisis counselors who can help people experiencing mental health-related distress. People can call or text 988 or chat 988lifeline.org for themselves or if they are worried about a loved one who may need crisis support.

## 2024-01-13 NOTE — ED Notes (Signed)
 Patient discharged by provider.
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
## 2024-01-14 ENCOUNTER — Other Ambulatory Visit (INDEPENDENT_AMBULATORY_CARE_PROVIDER_SITE_OTHER): Payer: MEDICAID

## 2024-01-14 ENCOUNTER — Ambulatory Visit: Payer: MEDICAID | Admitting: Orthopedic Surgery

## 2024-01-14 DIAGNOSIS — M5416 Radiculopathy, lumbar region: Secondary | ICD-10-CM

## 2024-01-14 NOTE — Progress Notes (Signed)
 Orthopedic Spine Surgery Office Note   Assessment: Patient is a 33 y.o. male with history of L5/S1 laminectomy who presents with chronic low back pain and bilateral lower extremity pain. Has foraminal stenosis at L5/S1 bilaterally causing lumbar radiculopathy     Plan: -Patient has tried activity modification, physical therapy, Tylenol , Mobic , robaxin , gabapentin , lumbar steroid injections -Talked about his remaining options at this point since he has tried multiple medications and injections. At this point, his remaining options L5/S1 ALIF, chiropractic care, or pain management. He wanted to think about his options and let me know how he would like to proceed. Covered the risks and benefits of the ALIF so he had a sense of what are realistic expectations and what complications are seen -Since he is only getting about a week of relief with injections, did not recommend that as a continued treatment -Patient should return to office on an as needed basis     Patient expressed understanding of the plan and all questions were answered to the patient's satisfaction.    ___________________________________________________________________________   History: Patient is a 33 y.o. male who comes in today to follow up on his lumbar spine.  Patient continues to have low back pain that radiates into his bilateral lower extremities.  He feels it going into the left buttock and posterior thigh.  The right he feels like going in multiple areas but the worst is in his buttock and lateral aspect of the thigh.  He recently had another injection with Dr. Daisey Dryer and it was helpful but it only lasted about a week.  He has not developed any new symptoms since he was last seen in the office.   Previous treatments: activity modification, tylenol , mobic , physical therapy, robaxin , gabapentin , lumbar steroid injections     Physical Exam:   General: no acute distress, appears stated age Neurologic: alert, answering  questions appropriately, following commands Respiratory: unlabored breathing on room air, symmetric chest rise Psychiatric: appropriate affect, normal cadence to speech     MSK (spine):   -Strength exam                                                   Left                  Right EHL                              5/5                  5/5 TA                                 5/5                  5/5 GSC                             5/5                  5/5 Knee extension            5/5  5/5 Hip flexion                    5/5                  5/5   -Sensory exam                           Sensation intact to light touch in L3-S1 nerve distributions of bilateral lower extremities   -Achilles DTR: 2/4 on the left, 2/4 on the right -Patellar tendon DTR: 2/4 on the left, 2/4 on the right   -Straight leg raise: Negative bilaterally -Clonus: no beats bilaterally     Imaging: XRs of the lumbar spine from 01/14/2024 were independently reviewed and interpreted, showing disc height loss at L5/S1. Laminectomy defect at L5/S1. No evidence of instability on flexion/extension views.  No fracture or dislocation.    MRI of the lumbar spine from 04/27/2023 was previously independently interpreted, showing disc desiccation at L4/5.  DDD at L5/S1 bilateral foraminal stenosis at L5/S1.  Disc herniation at L5/S1.  No other significant stenosis seen.  Laminectomy defect at L5/S1.     Patient name: Bryce Leon Patient MRN: [id number] Date of visit: 01/14/24

## 2024-02-02 ENCOUNTER — Ambulatory Visit: Payer: MEDICAID | Admitting: Orthopedic Surgery

## 2024-07-03 ENCOUNTER — Encounter: Payer: Self-pay | Admitting: Radiology
